# Patient Record
Sex: Female | Born: 1960 | Race: White | Hispanic: No | Marital: Married | Smoking: Never smoker
Health system: Southern US, Community
[De-identification: ages and names within clinical notes are randomized; demographics above are authoritative.]

## PROBLEM LIST (undated history)

## (undated) DIAGNOSIS — M545 Low back pain, unspecified: Secondary | ICD-10-CM

## (undated) DIAGNOSIS — M25562 Pain in left knee: Secondary | ICD-10-CM

## (undated) DIAGNOSIS — I8390 Asymptomatic varicose veins of unspecified lower extremity: Secondary | ICD-10-CM

## (undated) DIAGNOSIS — D649 Anemia, unspecified: Secondary | ICD-10-CM

## (undated) HISTORY — DX: Asymptomatic varicose veins of unspecified lower extremity: I83.90

## (undated) HISTORY — PX: ABDOMINAL HYSTERECTOMY: SHX81

## (undated) HISTORY — PX: CHOLECYSTECTOMY: SHX55

## (undated) HISTORY — DX: Pain in left knee: M25.562

## (undated) HISTORY — DX: Low back pain: M54.5

## (undated) HISTORY — DX: Low back pain, unspecified: M54.50

## (undated) HISTORY — DX: Anemia, unspecified: D64.9

---

## 1989-10-10 HISTORY — PX: OTHER SURGICAL HISTORY: SHX169

## 1995-10-11 HISTORY — PX: BACK SURGERY: SHX140

## 1999-10-11 HISTORY — PX: OTHER SURGICAL HISTORY: SHX169

## 2009-07-01 ENCOUNTER — Ambulatory Visit: Payer: Self-pay | Admitting: Vascular Surgery

## 2009-07-08 ENCOUNTER — Encounter: Admission: RE | Admit: 2009-07-08 | Discharge: 2009-07-08 | Payer: Self-pay | Admitting: Vascular Surgery

## 2009-07-08 ENCOUNTER — Ambulatory Visit: Payer: Self-pay | Admitting: Vascular Surgery

## 2009-07-13 ENCOUNTER — Ambulatory Visit (HOSPITAL_COMMUNITY): Admission: RE | Admit: 2009-07-13 | Discharge: 2009-07-13 | Payer: Self-pay | Admitting: Obstetrics and Gynecology

## 2009-07-28 ENCOUNTER — Ambulatory Visit (HOSPITAL_COMMUNITY): Admission: RE | Admit: 2009-07-28 | Discharge: 2009-07-28 | Payer: Self-pay | Admitting: Obstetrics and Gynecology

## 2009-08-10 ENCOUNTER — Encounter: Payer: Self-pay | Admitting: Obstetrics and Gynecology

## 2009-08-10 ENCOUNTER — Ambulatory Visit (HOSPITAL_COMMUNITY): Admission: RE | Admit: 2009-08-10 | Discharge: 2009-08-10 | Payer: Self-pay | Admitting: Obstetrics and Gynecology

## 2010-03-06 IMAGING — US US TRANSVAGINAL NON-OB
1 series · 14 of 25 positions shown · non-contrast
Comparison: CT 07/08/2009

CLINICAL DATA: Right ovarian cyst of 48-year-old female

TRANSABDOMINAL AND TRANSVAGINAL ULTRASOUND OF PELVIS
TECHNIQUE: Both transabdominal and transvaginal ultrasound
examinations of the pelvis were performed including evaluation of
the uterus, ovaries, adnexal regions, and pelvic cul-de-sac.

[Series 1: us transvaginal non-ob · 0.32mm/px · 14 of 45 slices shown]
[im 1/45]
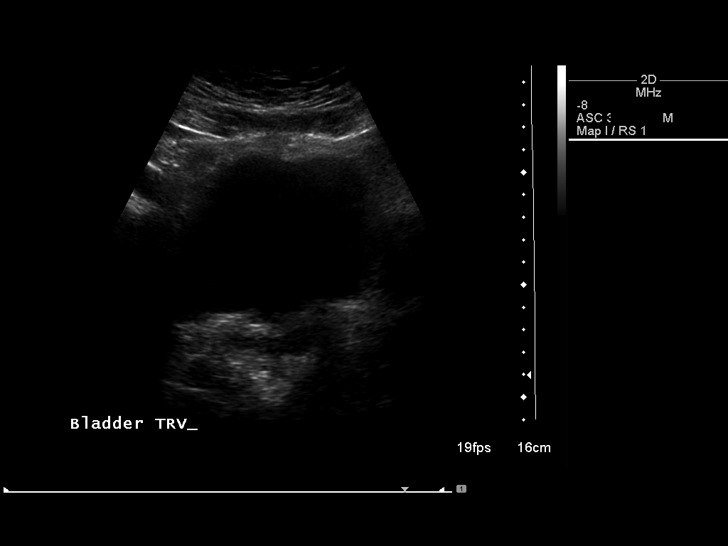
[im 4/45]
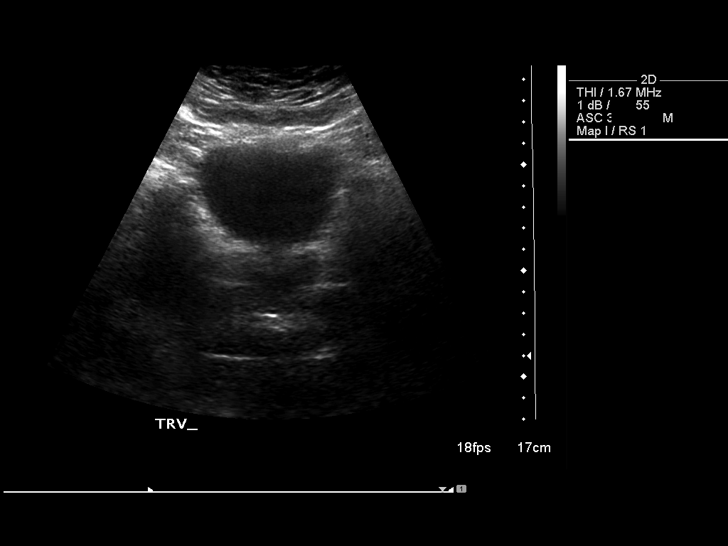
[im 8/45]
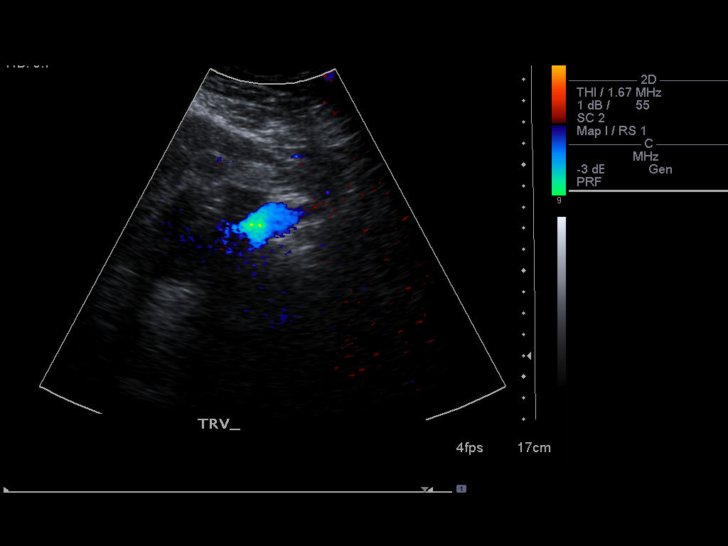
[im 12/45]
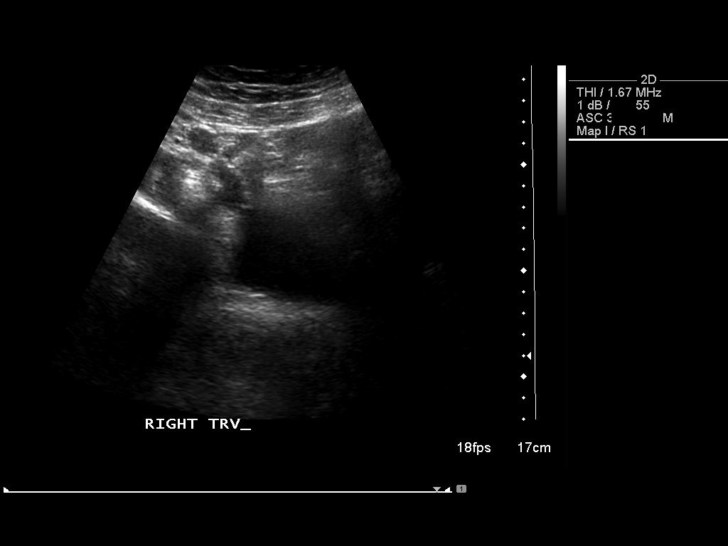
[im 15/45]
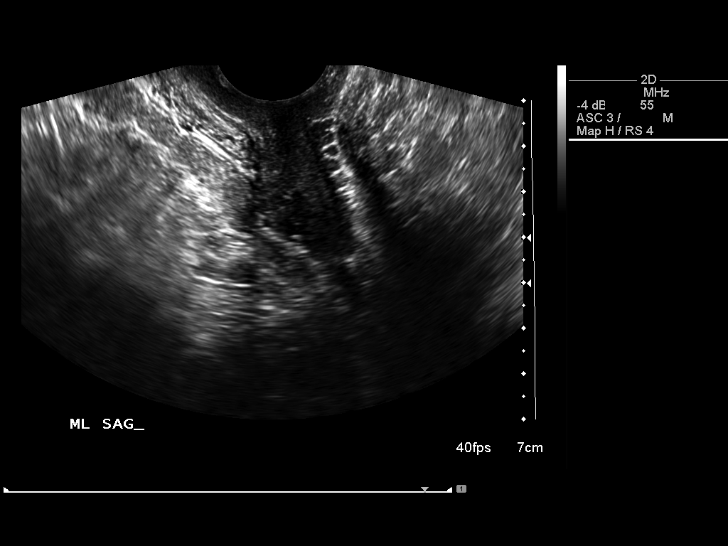
[im 17/45]
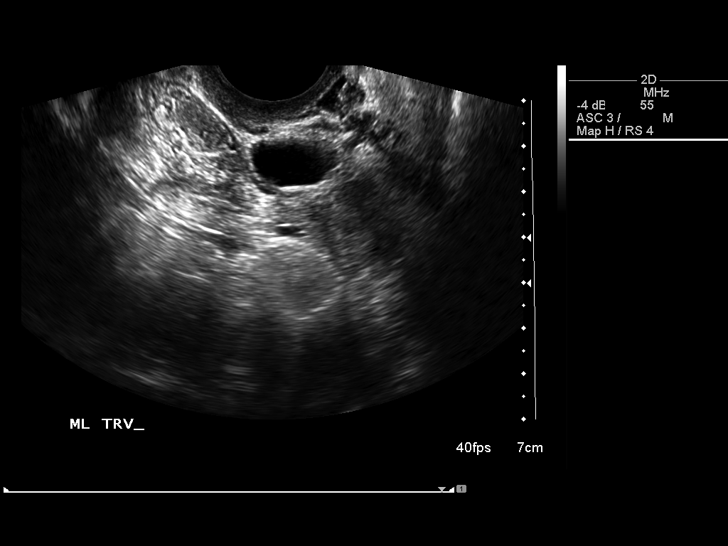
[im 21/45]
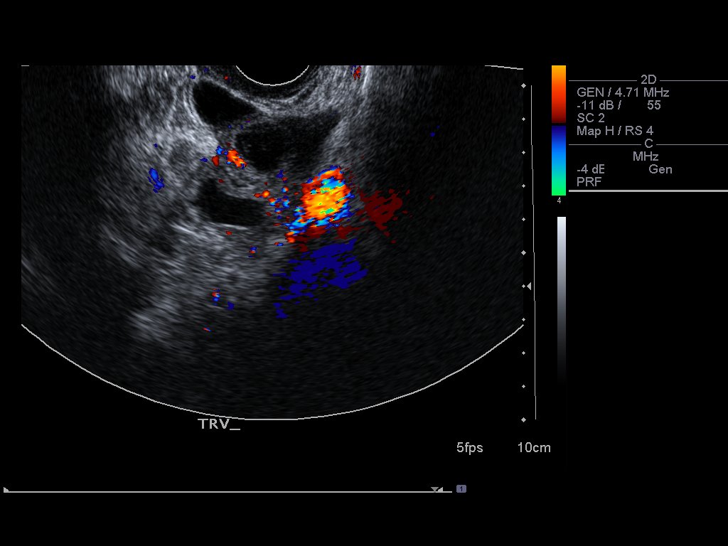
[im 24/45]
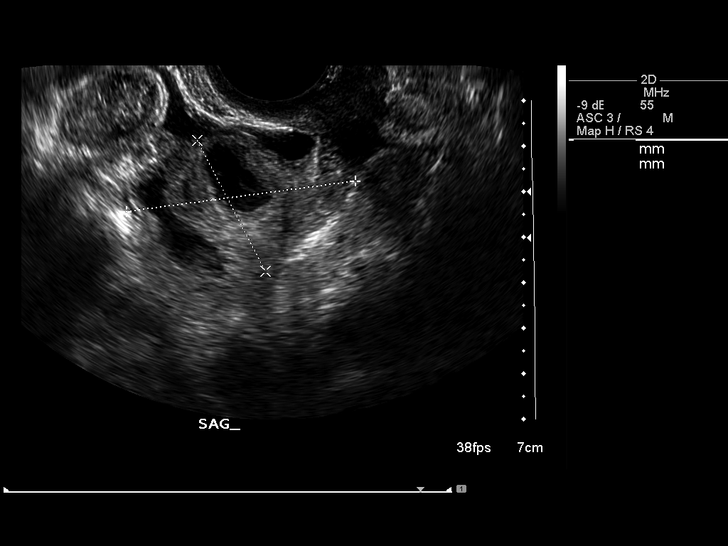
[im 28/45]
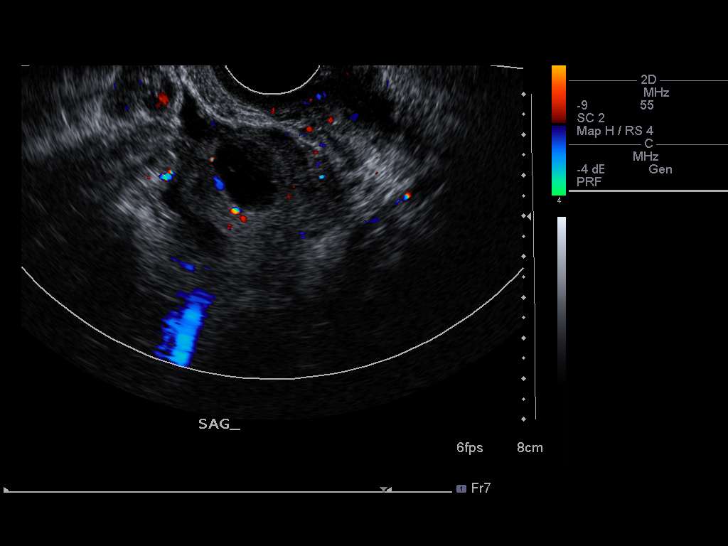
[im 30/45]
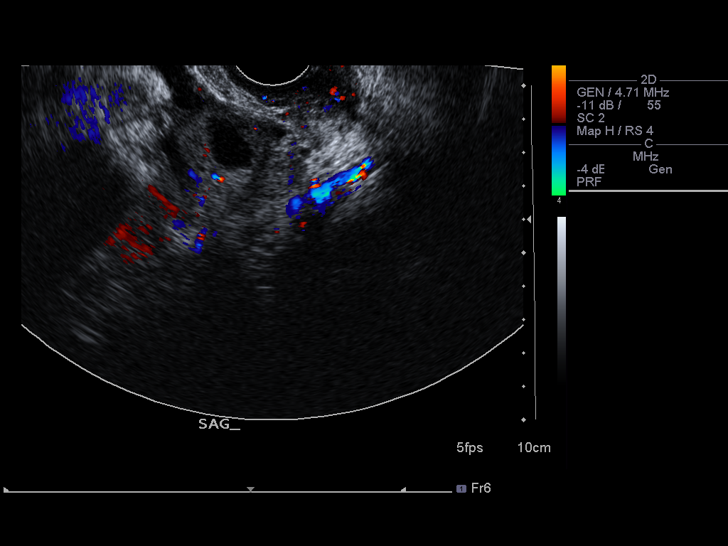
[im 34/45]
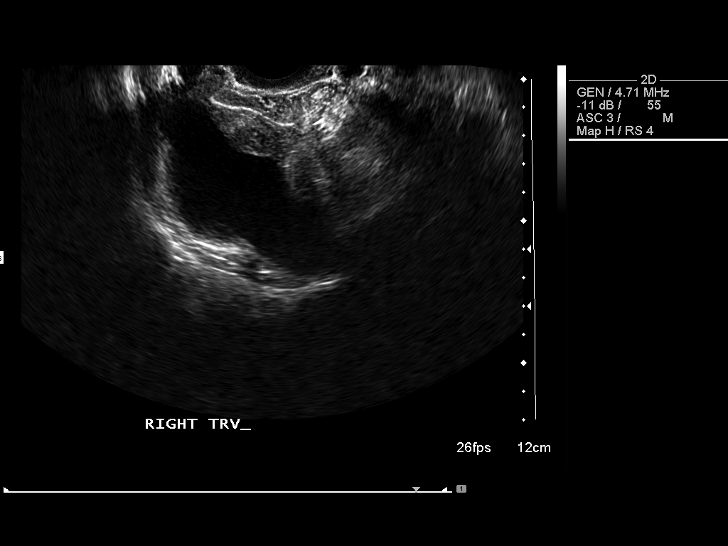
[im 37/45]
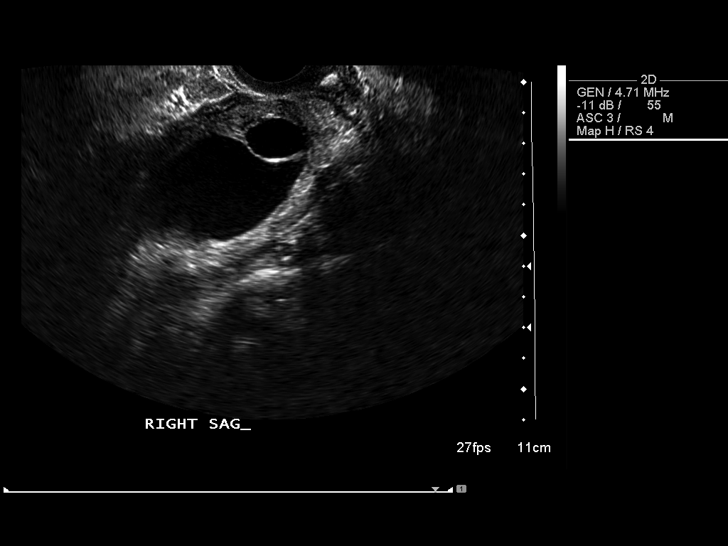
[im 41/45]
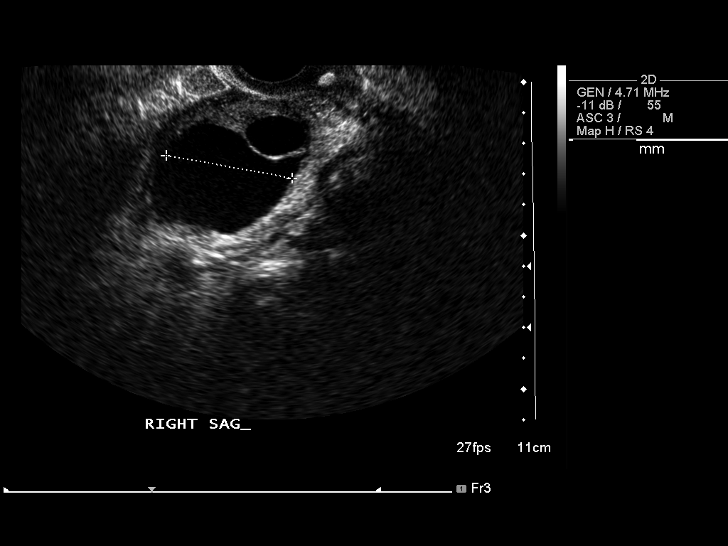
[im 45/45]
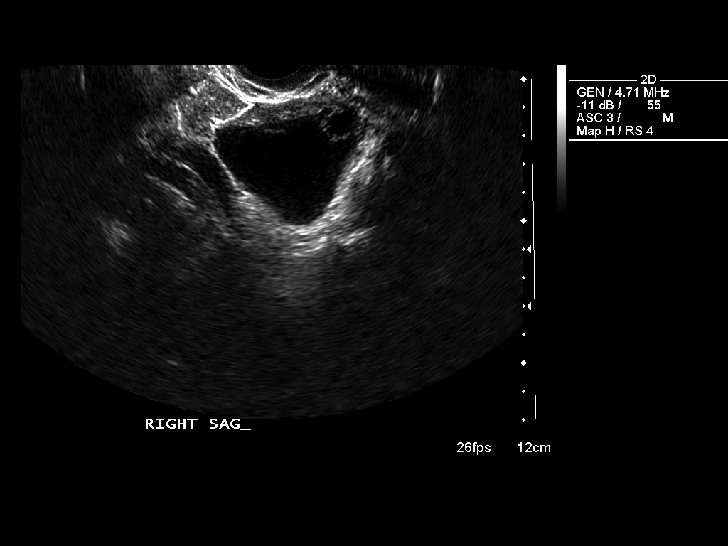

[14 of 25 positions shown; findings below may reference images not displayed]

FINDINGS: Uterus surgically absent

Endometrium there is an absent

Right Ovary There is a complex predominately cystic mass associate
right ovary measures 8.9 x 4.8 x 6.2 cm.  There is some wall
thickening associated this cystic lesion as well as flow within the
thickened wall.

Left Ovary Complex cystic lesion in the left ovary with wall
thickening and flow.  This mass measures 5.1 x 3.2 x 2.9 cm.

Other Findings:  None
IMPRESSION: Bilateral complex cystic ovarian lesions with wall thickening and
vascularity.  Differing size calculations may be secondary to
differing techniques however cannot exclude enlargement of these
lesions.  Findings are concerning for ovarian epithelial neoplasm.
An MRI without with contrast of the pelvis may be of value in
further characterizing these lesions or for surgical planning.

## 2011-01-12 LAB — URINALYSIS, ROUTINE W REFLEX MICROSCOPIC
Bilirubin Urine: NEGATIVE
Glucose, UA: NEGATIVE mg/dL
Hgb urine dipstick: NEGATIVE
Protein, ur: NEGATIVE mg/dL
Specific Gravity, Urine: 1.01 (ref 1.005–1.030)
Urobilinogen, UA: 0.2 mg/dL (ref 0.0–1.0)
pH: 7.5 (ref 5.0–8.0)

## 2011-01-13 LAB — TYPE AND SCREEN: ABO/RH(D): O POS

## 2011-01-13 LAB — COMPREHENSIVE METABOLIC PANEL
GFR calc non Af Amer: >60 mL/min (ref >60)
Potassium: 3.9 mEq/L (ref 3.5–5.1)
Sodium: 136 mEq/L (ref 135–145)
Total Bilirubin: 1 mg/dL (ref 0.3–1.2)
Total Protein: 7.3 g/dL (ref 6.0–8.3)

## 2011-01-13 LAB — CBC
HCT: 34 % — ABNORMAL LOW (ref 36.0–46.0)
MCV: 89 fL (ref 78.0–100.0)

## 2011-02-22 NOTE — Assessment & Plan Note (Signed)
OFFICE VISIT   Sandra Frank, Sandra Frank  DOB:  [birth year]                                       07/08/2009  CHART#:[id number]   Patient returns for follow-up today.  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  She returns  today for follow-up of her CT angiogram.  She denies any significant  pain in the back or flank or abdomen.  CT angiogram was evaluated and  examined today. This shows a renal cyst in the right kidney, which is  probably consistent with what was previously seen on renal duplex exam.  There is no renal artery aneurysm in the right or left kidney.  However,  she was noted to have a 5-cm right adnexal mass.  Evaluation with  ultrasound was suggested.  I discussed this with patient at length  today.  She does not need further follow-up with me, as there is no  aneurysm present in the right renal artery.  However, I did discuss with  her that she would need evaluation by her GYN doctor, Dr. Emelda Fear, in  [place].  He will make a decision regarding whether or not ultrasound  would be necessary to evaluate her adnexal mass.  She will obtain a copy  of her CT scan and CT scan report to take with her to her appointment  with Dr. Emelda Fear.   Janetta Hora. Fields, MD  Electronically Signed   CEF/MEDQ  D:  07/08/2009  T:  07/09/2009  Job:  [id number]   cc:   Cristela Blue, M.D.

## 2011-02-22 NOTE — Assessment & Plan Note (Signed)
OFFICE VISIT   ADAIJAH, ENDRES  DOB:  [birth year]                                       07/01/2009  CHART#:[id number]   The patient is a 50 year old female sent for evaluation for possible  renal artery aneurysm.  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  This was also seen  on a recent duplex ultrasound.  The aneurysm was found to be between  0.80-mm and 0.9-mm in diameter within the right renal parenchyma.  The  patient denies any flank or abdominal pain.  She has no history of  hypertension.  She has no history of renal dysfunction.  She has no  family history of renal aneurysms.   PAST MEDICAL HISTORY:  Is fairly unremarkable.   PAST SURGICAL HISTORY:  She has had lumbar spine surgery,  cholecystectomy and a hysterectomy.   MEDICATIONS:  Nabumetone 750 mg p.r.n., Skelaxin 800 mg p.r.n., aspirin  81 mg once a day.   She states a side effect to Darvocet of nausea.   FAMILY HISTORY:  Is unremarkable for any aneurysm disease.   SOCIAL HISTORY:  She is married.  She is a nonsmoker, non-consumer of  alcohol.   REVIEW OF SYSTEMS:  She is 5 feet 9 inches, 265 pounds.  RENAL:  She has some frequency.  ORTHOPEDIC:  She has multiple joint arthritis and muscle pain.  HEMATOLOGIC:  She has a history of chronic anemia.  CARDIAC, PULMONARY, GI, VASCULAR, NEUROLOGIC, PSYCHIATRIC, ENT AND  REVIEW OF SYSTEMS:  Are otherwise negative.   PHYSICAL EXAM:  Blood pressure is 134/85 in the left arm, pulse is 69  and regular.  HEENT:  Unremarkable.  Neck:  Has 2+ carotid pulses  without bruit.  Chest:  Clear to auscultation.  Cardiac Exam:  Regular  rate and rhythm without murmur.  Abdomen:  Is obese, soft, nontender,  nondistended.  No masses.  Extremities:  She has 2+ radial, femoral,  dorsalis pedis and posterior tibial pulses bilaterally.  She has no  audible bruit within the abdomen.   I reviewed her  renal ultrasound done at [facility] dated May 26, 2009.  This showed a possible 8-mm to 9-mm rounded structure in the  right kidney which did have Doppler flow in it and it was possibly an  aneurysm.   I believe the best option for the patient at this point would be a CT  angiogram to further define the structure in her right kidney.  If this  is indeed an aneurysm it is fairly small in character and would  certainly warrant observation over time and no intervention at this  time.  She will return for follow-up with me after a CT angiogram and we  will discuss the findings with her at that time.   Janetta Hora. Fields, MD  Electronically Signed   CEF/MEDQ  D:  07/01/2009  T:  07/02/2009  Job:  [id number]   cc:   Donzetta Sprung

## 2011-05-23 DIAGNOSIS — R079 Chest pain, unspecified: Secondary | ICD-10-CM

## 2011-06-07 ENCOUNTER — Other Ambulatory Visit: Payer: Self-pay | Admitting: Obstetrics and Gynecology

## 2011-06-07 DIAGNOSIS — Z139 Encounter for screening, unspecified: Secondary | ICD-10-CM

## 2011-06-16 ENCOUNTER — Ambulatory Visit (HOSPITAL_COMMUNITY)
Admission: RE | Admit: 2011-06-16 | Discharge: 2011-06-16 | Disposition: A | Discharge status: Home / Self Care | Payer: BC Managed Care – PPO | Source: Ambulatory Visit | Attending: Obstetrics and Gynecology | Admitting: Obstetrics and Gynecology

## 2011-06-16 ENCOUNTER — Ambulatory Visit (HOSPITAL_COMMUNITY): Payer: Managed Care, Other (non HMO)

## 2011-06-16 DIAGNOSIS — Z1231 Encounter for screening mammogram for malignant neoplasm of breast: Secondary | ICD-10-CM | POA: Insufficient documentation

## 2011-06-16 DIAGNOSIS — Z139 Encounter for screening, unspecified: Secondary | ICD-10-CM

## 2012-02-21 ENCOUNTER — Ambulatory Visit (INDEPENDENT_AMBULATORY_CARE_PROVIDER_SITE_OTHER): Payer: BC Managed Care – PPO | Admitting: Urology

## 2012-02-21 DIAGNOSIS — N949 Unspecified condition associated with female genital organs and menstrual cycle: Secondary | ICD-10-CM

## 2012-02-21 DIAGNOSIS — N8110 Cystocele, unspecified: Secondary | ICD-10-CM

## 2012-02-21 DIAGNOSIS — N3946 Mixed incontinence: Secondary | ICD-10-CM

## 2012-03-22 ENCOUNTER — Other Ambulatory Visit: Payer: Self-pay

## 2012-03-22 DIAGNOSIS — I83893 Varicose veins of bilateral lower extremities with other complications: Secondary | ICD-10-CM

## 2012-04-25 ENCOUNTER — Encounter: Payer: Self-pay | Admitting: Vascular Surgery

## 2012-05-08 ENCOUNTER — Ambulatory Visit (INDEPENDENT_AMBULATORY_CARE_PROVIDER_SITE_OTHER): Payer: BC Managed Care – PPO | Admitting: Urology

## 2012-05-08 DIAGNOSIS — N3946 Mixed incontinence: Secondary | ICD-10-CM

## 2012-05-08 DIAGNOSIS — N8110 Cystocele, unspecified: Secondary | ICD-10-CM

## 2012-05-09 ENCOUNTER — Encounter: Payer: Self-pay | Admitting: Vascular Surgery

## 2012-05-09 ENCOUNTER — Other Ambulatory Visit: Payer: Self-pay | Admitting: Obstetrics and Gynecology

## 2012-05-09 DIAGNOSIS — Z139 Encounter for screening, unspecified: Secondary | ICD-10-CM

## 2012-05-10 ENCOUNTER — Ambulatory Visit (INDEPENDENT_AMBULATORY_CARE_PROVIDER_SITE_OTHER): Payer: BC Managed Care – PPO | Admitting: Vascular Surgery

## 2012-05-10 ENCOUNTER — Encounter: Payer: Self-pay | Admitting: Vascular Surgery

## 2012-05-10 ENCOUNTER — Encounter (INDEPENDENT_AMBULATORY_CARE_PROVIDER_SITE_OTHER): Payer: BC Managed Care – PPO | Admitting: *Deleted

## 2012-05-10 VITALS — BP 161/94 | HR 67 | Resp 16 | Ht 68.0 in | Wt 288.0 lb

## 2012-05-10 DIAGNOSIS — I83893 Varicose veins of bilateral lower extremities with other complications: Secondary | ICD-10-CM

## 2012-05-10 DIAGNOSIS — R609 Edema, unspecified: Secondary | ICD-10-CM

## 2012-05-10 NOTE — Progress Notes (Signed)
[facility] [place] HISTORY AND PHYSICAL   History of Present Illness:  Patient is a 51 y.o. year old female who presents for evaluation of pain and swelling in her lower extremities. Her left leg is worse than her right. She develops swelling after being on legs all day. She has been and lower extremity compression stockings 20-30 mm for the last month prescribed Dr. Reuel Boom. She has had some relief with this. However, she is still fairly disabled by her symptoms. Her symptoms are relieved with leg elevation. She does not describe claudication..  Other medical problems include chronic back pain chronic knee pain all of which are currently stable. She denies prior history of DVT. She did injure her left leg several years ago had some swelling at that time but does not know she had a DVT.  Past Medical History  Diagnosis Date  . Varicose veins   . Low back pain     History of surgery and epidural injections  . Left knee pain   . Anemia     Past Surgical History  Procedure Date  . Abdominal hysterectomy     2010  . Tubaligation 1991  . Cholecystectomy   . Back surgery 1997  . Bladder  2001    tact     Social History History  Substance Use Topics  . Smoking status: Never Smoker   . Smokeless tobacco: Never Used  . Alcohol Use: No    Family History Family History  Problem Relation Age of Onset  . Heart disease Mother   . Hypertension Mother   . Other Mother     varicose veins  . Hyperlipidemia Daughter   . Hypertension Daughter   . Other Daughter     varicose veins    Allergies  No Known Allergies   Current Outpatient Prescriptions  Medication Sig Dispense Refill  . dicyclomine (BENTYL) 20 MG tablet Take 20 mg by mouth every 6 (six) hours.      . meloxicam (MOBIC) 15 MG tablet Take 15 mg by mouth daily.      . mirabegron ER (MYRBETRIQ) 25 MG TB24 Take 25 mg by mouth daily.      . Multiple Vitamins-Minerals (MULTIVITAMIN PO) Take 1 tablet by  mouth daily.      . Omega-3 Fatty Acids-Vitamin E (COROMEGA PO) Take by mouth daily.      Marland Kitchen omeprazole (PRILOSEC) 20 MG capsule Take 20 mg by mouth daily.      Marland Kitchen tiZANidine (ZANAFLEX) 4 MG tablet Take 4 mg by mouth every 8 (eight) hours as needed.       Marland Kitchen estrogens, conjugated, (PREMARIN) 0.625 MG tablet Take 0.625 mg by mouth daily. Take daily for 21 days then do not take for 7 days.      . fesoterodine (TOVIAZ) 4 MG TB24 Take 4 mg by mouth daily.      . naproxen (NAPROSYN) 500 MG tablet Take 500 mg by mouth 2 (two) times daily with a meal.        ROS:   General:  No weight loss, Fever, chills  HEENT: No recent headaches, no nasal bleeding, no visual changes, no sore throat  Neurologic: No dizziness, blackouts, seizures. No recent symptoms of stroke or mini- stroke. No recent episodes of slurred speech, or temporary blindness.  Cardiac: No recent episodes of chest pain/pressure, no shortness of breath at rest.  No shortness of breath with exertion.  Denies history of atrial fibrillation or irregular heartbeat  Vascular: No history of rest pain in feet.  No history of claudication.  No history of non-healing ulcer, No history of DVT   Pulmonary: No home oxygen, no productive cough, no hemoptysis,  No asthma or wheezing  Musculoskeletal:  [x ] Arthritis, [x ] Low back pain,  [x ] Joint pain  Hematologic:No history of hypercoagulable state.  No history of easy bleeding.  No history of anemia  Gastrointestinal: No hematochezia or melena,  No gastroesophageal reflux, no trouble swallowing  Urinary: [ ]  chronic Kidney disease, [ ]  on HD - [ ]  MWF or [ ]  TTHS, [ ]  Burning with urination, [ ]  Frequent urination, [ ]  Difficulty urinating;   Skin: No rashes  Psychological: No history of anxiety,  No history of depression   Physical Examination  Filed Vitals:   05/10/12 1210  BP: 161/94  Pulse: 67  Resp: 16  Height: 5\' 8"  (1.727 m)  Weight: 288 lb (130.636 kg)  SpO2: 100%     Body mass index is 43.79 kg/(m^2).  General:  Alert and oriented, no acute distress HEENT: Normal Neck: No bruit or JVD Pulmonary: Clear to auscultation bilaterally Cardiac: Regular Rate and Rhythm without murmur Abdomen: Soft, non-tender, non-distended, no mass, obese Skin: No rash, several clusters of spider-type varicosities over the right posterior hip right lateral leg as well as the medial aspect of the left leg, there is a large cluster of easily palpable varicosities in the medial aspect of the left leg which are 6-7 mm in diameter there is a similar cluster on the right leg although less prominent Extremity Pulses:  2+ radial, brachial, femoral, dorsalis pedis pulses bilaterally Musculoskeletal: No deformity or edema  Neurologic: Upper and lower extremity motor 5/5 and symmetric  DATA: The patient had a venous duplex exam today which I reviewed and interpreted. 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 She also did have mild deep venous reflux. She had no evidence of DVT.   ASSESSMENT: Bilateral symptomatic varicose veins   PLAN:  The patient was given a prescription today for the pantyhose length compression stockings (20-30 mm Hg) that she thinks she will have much better compliance with these. She'll return in 3 months time to see my partner Dr. early to consider laser ablation.  Fabienne Bruns, MD [facility] [place] Office: [phone] Pager: [phone]

## 2012-05-22 ENCOUNTER — Telehealth: Payer: Self-pay | Admitting: *Deleted

## 2012-05-22 NOTE — Telephone Encounter (Signed)
In response to Long Term Disability claim and paperwork received from [employer]. Ladona Ridgel from the [employer] in [place], [place], I notified Mrs. Nasby that [facility] did not handle long-term disability claims and paperwork.  I suggested that she contact her Primary Care Physician to assist her with the long term disability process.  Rankin, Neena Rhymes

## 2012-06-19 ENCOUNTER — Ambulatory Visit (HOSPITAL_COMMUNITY)
Admission: RE | Admit: 2012-06-19 | Discharge: 2012-06-19 | Disposition: A | Discharge status: Home / Self Care | Payer: BC Managed Care – PPO | Source: Ambulatory Visit | Attending: Obstetrics and Gynecology | Admitting: Obstetrics and Gynecology

## 2012-06-19 DIAGNOSIS — Z139 Encounter for screening, unspecified: Secondary | ICD-10-CM

## 2012-06-19 DIAGNOSIS — Z1231 Encounter for screening mammogram for malignant neoplasm of breast: Secondary | ICD-10-CM | POA: Insufficient documentation

## 2012-08-13 ENCOUNTER — Encounter: Payer: Self-pay | Admitting: Vascular Surgery

## 2012-08-14 ENCOUNTER — Ambulatory Visit: Payer: BC Managed Care – PPO | Admitting: Vascular Surgery

## 2013-07-18 DIAGNOSIS — Z0279 Encounter for issue of other medical certificate: Secondary | ICD-10-CM

## 2015-05-27 DIAGNOSIS — I872 Venous insufficiency (chronic) (peripheral): Secondary | ICD-10-CM | POA: Insufficient documentation

## 2016-02-15 DIAGNOSIS — I83892 Varicose veins of left lower extremities with other complications: Secondary | ICD-10-CM | POA: Diagnosis not present

## 2016-02-22 DIAGNOSIS — I872 Venous insufficiency (chronic) (peripheral): Secondary | ICD-10-CM | POA: Diagnosis not present

## 2016-03-25 DIAGNOSIS — J301 Allergic rhinitis due to pollen: Secondary | ICD-10-CM | POA: Diagnosis not present

## 2016-03-25 DIAGNOSIS — F331 Major depressive disorder, recurrent, moderate: Secondary | ICD-10-CM | POA: Diagnosis not present

## 2016-03-25 DIAGNOSIS — E6609 Other obesity due to excess calories: Secondary | ICD-10-CM | POA: Diagnosis not present

## 2016-03-25 DIAGNOSIS — M170 Bilateral primary osteoarthritis of knee: Secondary | ICD-10-CM | POA: Diagnosis not present

## 2016-03-25 DIAGNOSIS — Z6841 Body Mass Index (BMI) 40.0 and over, adult: Secondary | ICD-10-CM | POA: Diagnosis not present

## 2016-03-25 DIAGNOSIS — Z1389 Encounter for screening for other disorder: Secondary | ICD-10-CM | POA: Diagnosis not present

## 2016-03-25 DIAGNOSIS — I10 Essential (primary) hypertension: Secondary | ICD-10-CM | POA: Diagnosis not present

## 2016-03-25 DIAGNOSIS — E782 Mixed hyperlipidemia: Secondary | ICD-10-CM | POA: Diagnosis not present

## 2016-04-13 DIAGNOSIS — I872 Venous insufficiency (chronic) (peripheral): Secondary | ICD-10-CM | POA: Diagnosis not present

## 2016-04-27 DIAGNOSIS — S90112A Contusion of left great toe without damage to nail, initial encounter: Secondary | ICD-10-CM | POA: Diagnosis not present

## 2016-04-27 DIAGNOSIS — M79672 Pain in left foot: Secondary | ICD-10-CM | POA: Diagnosis not present

## 2016-04-27 DIAGNOSIS — M1990 Unspecified osteoarthritis, unspecified site: Secondary | ICD-10-CM | POA: Diagnosis not present

## 2016-05-19 DIAGNOSIS — S90112A Contusion of left great toe without damage to nail, initial encounter: Secondary | ICD-10-CM | POA: Diagnosis not present

## 2016-05-19 DIAGNOSIS — M1990 Unspecified osteoarthritis, unspecified site: Secondary | ICD-10-CM | POA: Diagnosis not present

## 2016-05-19 DIAGNOSIS — M79672 Pain in left foot: Secondary | ICD-10-CM | POA: Diagnosis not present

## 2016-06-15 DIAGNOSIS — Z6841 Body Mass Index (BMI) 40.0 and over, adult: Secondary | ICD-10-CM | POA: Diagnosis not present

## 2016-06-15 DIAGNOSIS — M545 Low back pain: Secondary | ICD-10-CM | POA: Diagnosis not present

## 2016-08-03 DIAGNOSIS — S90112A Contusion of left great toe without damage to nail, initial encounter: Secondary | ICD-10-CM | POA: Diagnosis not present

## 2016-08-03 DIAGNOSIS — M1990 Unspecified osteoarthritis, unspecified site: Secondary | ICD-10-CM | POA: Diagnosis not present

## 2016-08-03 DIAGNOSIS — M79672 Pain in left foot: Secondary | ICD-10-CM | POA: Diagnosis not present

## 2016-08-10 DIAGNOSIS — M170 Bilateral primary osteoarthritis of knee: Secondary | ICD-10-CM | POA: Diagnosis not present

## 2016-08-10 DIAGNOSIS — E6609 Other obesity due to excess calories: Secondary | ICD-10-CM | POA: Diagnosis not present

## 2016-08-10 DIAGNOSIS — Z6841 Body Mass Index (BMI) 40.0 and over, adult: Secondary | ICD-10-CM | POA: Diagnosis not present

## 2016-08-10 DIAGNOSIS — I10 Essential (primary) hypertension: Secondary | ICD-10-CM | POA: Diagnosis not present

## 2016-08-10 DIAGNOSIS — M545 Low back pain: Secondary | ICD-10-CM | POA: Diagnosis not present

## 2016-08-10 DIAGNOSIS — F331 Major depressive disorder, recurrent, moderate: Secondary | ICD-10-CM | POA: Diagnosis not present

## 2016-08-10 DIAGNOSIS — K580 Irritable bowel syndrome with diarrhea: Secondary | ICD-10-CM | POA: Diagnosis not present

## 2016-08-10 DIAGNOSIS — E782 Mixed hyperlipidemia: Secondary | ICD-10-CM | POA: Diagnosis not present

## 2016-09-30 DIAGNOSIS — M47816 Spondylosis without myelopathy or radiculopathy, lumbar region: Secondary | ICD-10-CM | POA: Diagnosis not present

## 2016-09-30 DIAGNOSIS — M9903 Segmental and somatic dysfunction of lumbar region: Secondary | ICD-10-CM | POA: Diagnosis not present

## 2016-09-30 DIAGNOSIS — M5442 Lumbago with sciatica, left side: Secondary | ICD-10-CM | POA: Diagnosis not present

## 2016-09-30 DIAGNOSIS — M19072 Primary osteoarthritis, left ankle and foot: Secondary | ICD-10-CM | POA: Diagnosis not present

## 2016-10-05 DIAGNOSIS — M9903 Segmental and somatic dysfunction of lumbar region: Secondary | ICD-10-CM | POA: Diagnosis not present

## 2016-10-05 DIAGNOSIS — M47816 Spondylosis without myelopathy or radiculopathy, lumbar region: Secondary | ICD-10-CM | POA: Diagnosis not present

## 2016-10-05 DIAGNOSIS — M19072 Primary osteoarthritis, left ankle and foot: Secondary | ICD-10-CM | POA: Diagnosis not present

## 2016-10-05 DIAGNOSIS — M5442 Lumbago with sciatica, left side: Secondary | ICD-10-CM | POA: Diagnosis not present

## 2016-10-13 DIAGNOSIS — M5442 Lumbago with sciatica, left side: Secondary | ICD-10-CM | POA: Diagnosis not present

## 2016-10-13 DIAGNOSIS — M47816 Spondylosis without myelopathy or radiculopathy, lumbar region: Secondary | ICD-10-CM | POA: Diagnosis not present

## 2016-10-13 DIAGNOSIS — M19072 Primary osteoarthritis, left ankle and foot: Secondary | ICD-10-CM | POA: Diagnosis not present

## 2016-10-13 DIAGNOSIS — M9903 Segmental and somatic dysfunction of lumbar region: Secondary | ICD-10-CM | POA: Diagnosis not present

## 2016-11-29 DIAGNOSIS — J301 Allergic rhinitis due to pollen: Secondary | ICD-10-CM | POA: Diagnosis not present

## 2016-11-29 DIAGNOSIS — I10 Essential (primary) hypertension: Secondary | ICD-10-CM | POA: Diagnosis not present

## 2016-11-29 DIAGNOSIS — M545 Low back pain: Secondary | ICD-10-CM | POA: Diagnosis not present

## 2016-11-29 DIAGNOSIS — F331 Major depressive disorder, recurrent, moderate: Secondary | ICD-10-CM | POA: Diagnosis not present

## 2016-11-29 DIAGNOSIS — M170 Bilateral primary osteoarthritis of knee: Secondary | ICD-10-CM | POA: Diagnosis not present

## 2016-11-29 DIAGNOSIS — E782 Mixed hyperlipidemia: Secondary | ICD-10-CM | POA: Diagnosis not present

## 2016-11-29 DIAGNOSIS — E6609 Other obesity due to excess calories: Secondary | ICD-10-CM | POA: Diagnosis not present

## 2016-11-29 DIAGNOSIS — K580 Irritable bowel syndrome with diarrhea: Secondary | ICD-10-CM | POA: Diagnosis not present

## 2016-12-07 DIAGNOSIS — M1711 Unilateral primary osteoarthritis, right knee: Secondary | ICD-10-CM | POA: Diagnosis not present

## 2017-04-04 DIAGNOSIS — R739 Hyperglycemia, unspecified: Secondary | ICD-10-CM | POA: Diagnosis not present

## 2017-04-04 DIAGNOSIS — K580 Irritable bowel syndrome with diarrhea: Secondary | ICD-10-CM | POA: Diagnosis not present

## 2017-04-04 DIAGNOSIS — I10 Essential (primary) hypertension: Secondary | ICD-10-CM | POA: Diagnosis not present

## 2017-04-04 DIAGNOSIS — Z9189 Other specified personal risk factors, not elsewhere classified: Secondary | ICD-10-CM | POA: Diagnosis not present

## 2017-04-04 DIAGNOSIS — M1711 Unilateral primary osteoarthritis, right knee: Secondary | ICD-10-CM | POA: Diagnosis not present

## 2017-04-04 DIAGNOSIS — E782 Mixed hyperlipidemia: Secondary | ICD-10-CM | POA: Diagnosis not present

## 2017-04-04 DIAGNOSIS — F331 Major depressive disorder, recurrent, moderate: Secondary | ICD-10-CM | POA: Diagnosis not present

## 2017-04-06 DIAGNOSIS — E6609 Other obesity due to excess calories: Secondary | ICD-10-CM | POA: Diagnosis not present

## 2017-04-06 DIAGNOSIS — I10 Essential (primary) hypertension: Secondary | ICD-10-CM | POA: Diagnosis not present

## 2017-04-06 DIAGNOSIS — Z6839 Body mass index (BMI) 39.0-39.9, adult: Secondary | ICD-10-CM | POA: Diagnosis not present

## 2017-04-06 DIAGNOSIS — J301 Allergic rhinitis due to pollen: Secondary | ICD-10-CM | POA: Diagnosis not present

## 2017-04-06 DIAGNOSIS — E782 Mixed hyperlipidemia: Secondary | ICD-10-CM | POA: Diagnosis not present

## 2017-04-06 DIAGNOSIS — F331 Major depressive disorder, recurrent, moderate: Secondary | ICD-10-CM | POA: Diagnosis not present

## 2017-04-06 DIAGNOSIS — K580 Irritable bowel syndrome with diarrhea: Secondary | ICD-10-CM | POA: Diagnosis not present

## 2017-04-06 DIAGNOSIS — Z0001 Encounter for general adult medical examination with abnormal findings: Secondary | ICD-10-CM | POA: Diagnosis not present

## 2017-04-20 DIAGNOSIS — Z6839 Body mass index (BMI) 39.0-39.9, adult: Secondary | ICD-10-CM | POA: Diagnosis not present

## 2017-04-20 DIAGNOSIS — I10 Essential (primary) hypertension: Secondary | ICD-10-CM | POA: Diagnosis not present

## 2017-04-20 DIAGNOSIS — M1712 Unilateral primary osteoarthritis, left knee: Secondary | ICD-10-CM | POA: Diagnosis not present

## 2017-08-01 DIAGNOSIS — Z9189 Other specified personal risk factors, not elsewhere classified: Secondary | ICD-10-CM | POA: Diagnosis not present

## 2017-08-01 DIAGNOSIS — K580 Irritable bowel syndrome with diarrhea: Secondary | ICD-10-CM | POA: Diagnosis not present

## 2017-08-01 DIAGNOSIS — M170 Bilateral primary osteoarthritis of knee: Secondary | ICD-10-CM | POA: Diagnosis not present

## 2017-08-01 DIAGNOSIS — F331 Major depressive disorder, recurrent, moderate: Secondary | ICD-10-CM | POA: Diagnosis not present

## 2017-08-01 DIAGNOSIS — M1712 Unilateral primary osteoarthritis, left knee: Secondary | ICD-10-CM | POA: Diagnosis not present

## 2017-08-01 DIAGNOSIS — I10 Essential (primary) hypertension: Secondary | ICD-10-CM | POA: Diagnosis not present

## 2017-08-01 DIAGNOSIS — M545 Low back pain: Secondary | ICD-10-CM | POA: Diagnosis not present

## 2017-08-01 DIAGNOSIS — E782 Mixed hyperlipidemia: Secondary | ICD-10-CM | POA: Diagnosis not present

## 2017-08-01 DIAGNOSIS — Z6839 Body mass index (BMI) 39.0-39.9, adult: Secondary | ICD-10-CM | POA: Diagnosis not present

## 2017-08-01 DIAGNOSIS — E6609 Other obesity due to excess calories: Secondary | ICD-10-CM | POA: Diagnosis not present

## 2017-08-01 DIAGNOSIS — J301 Allergic rhinitis due to pollen: Secondary | ICD-10-CM | POA: Diagnosis not present

## 2017-08-02 DIAGNOSIS — M1712 Unilateral primary osteoarthritis, left knee: Secondary | ICD-10-CM | POA: Diagnosis not present

## 2017-08-02 DIAGNOSIS — M1711 Unilateral primary osteoarthritis, right knee: Secondary | ICD-10-CM | POA: Diagnosis not present

## 2017-08-02 DIAGNOSIS — Z6839 Body mass index (BMI) 39.0-39.9, adult: Secondary | ICD-10-CM | POA: Diagnosis not present

## 2017-08-09 DIAGNOSIS — Z6841 Body Mass Index (BMI) 40.0 and over, adult: Secondary | ICD-10-CM | POA: Diagnosis not present

## 2017-08-09 DIAGNOSIS — M1711 Unilateral primary osteoarthritis, right knee: Secondary | ICD-10-CM | POA: Diagnosis not present

## 2017-08-09 DIAGNOSIS — M1712 Unilateral primary osteoarthritis, left knee: Secondary | ICD-10-CM | POA: Diagnosis not present

## 2017-08-17 DIAGNOSIS — M545 Low back pain: Secondary | ICD-10-CM | POA: Diagnosis not present

## 2017-08-17 DIAGNOSIS — I10 Essential (primary) hypertension: Secondary | ICD-10-CM | POA: Diagnosis not present

## 2017-08-17 DIAGNOSIS — M1712 Unilateral primary osteoarthritis, left knee: Secondary | ICD-10-CM | POA: Diagnosis not present

## 2017-08-17 DIAGNOSIS — M1711 Unilateral primary osteoarthritis, right knee: Secondary | ICD-10-CM | POA: Diagnosis not present

## 2017-08-17 DIAGNOSIS — Z6841 Body Mass Index (BMI) 40.0 and over, adult: Secondary | ICD-10-CM | POA: Diagnosis not present

## 2017-12-22 DIAGNOSIS — M65312 Trigger thumb, left thumb: Secondary | ICD-10-CM | POA: Diagnosis not present

## 2018-01-03 DIAGNOSIS — M1712 Unilateral primary osteoarthritis, left knee: Secondary | ICD-10-CM | POA: Diagnosis not present

## 2018-01-18 DIAGNOSIS — M1712 Unilateral primary osteoarthritis, left knee: Secondary | ICD-10-CM | POA: Diagnosis not present

## 2018-01-18 DIAGNOSIS — M958 Other specified acquired deformities of musculoskeletal system: Secondary | ICD-10-CM | POA: Diagnosis not present

## 2018-01-18 DIAGNOSIS — G8918 Other acute postprocedural pain: Secondary | ICD-10-CM | POA: Diagnosis not present

## 2018-01-18 DIAGNOSIS — M94262 Chondromalacia, left knee: Secondary | ICD-10-CM | POA: Diagnosis not present

## 2018-01-31 DIAGNOSIS — Z471 Aftercare following joint replacement surgery: Secondary | ICD-10-CM | POA: Diagnosis not present

## 2018-01-31 DIAGNOSIS — R269 Unspecified abnormalities of gait and mobility: Secondary | ICD-10-CM | POA: Diagnosis not present

## 2018-01-31 DIAGNOSIS — M25562 Pain in left knee: Secondary | ICD-10-CM | POA: Diagnosis not present

## 2018-01-31 DIAGNOSIS — R531 Weakness: Secondary | ICD-10-CM | POA: Diagnosis not present

## 2018-02-01 DIAGNOSIS — R531 Weakness: Secondary | ICD-10-CM | POA: Diagnosis not present

## 2018-02-01 DIAGNOSIS — M25562 Pain in left knee: Secondary | ICD-10-CM | POA: Diagnosis not present

## 2018-02-01 DIAGNOSIS — R269 Unspecified abnormalities of gait and mobility: Secondary | ICD-10-CM | POA: Diagnosis not present

## 2018-02-01 DIAGNOSIS — Z471 Aftercare following joint replacement surgery: Secondary | ICD-10-CM | POA: Diagnosis not present

## 2018-02-06 DIAGNOSIS — Z471 Aftercare following joint replacement surgery: Secondary | ICD-10-CM | POA: Diagnosis not present

## 2018-02-06 DIAGNOSIS — R269 Unspecified abnormalities of gait and mobility: Secondary | ICD-10-CM | POA: Diagnosis not present

## 2018-02-06 DIAGNOSIS — M25562 Pain in left knee: Secondary | ICD-10-CM | POA: Diagnosis not present

## 2018-02-06 DIAGNOSIS — R531 Weakness: Secondary | ICD-10-CM | POA: Diagnosis not present

## 2018-02-07 DIAGNOSIS — M1712 Unilateral primary osteoarthritis, left knee: Secondary | ICD-10-CM | POA: Diagnosis not present

## 2018-02-09 DIAGNOSIS — Z471 Aftercare following joint replacement surgery: Secondary | ICD-10-CM | POA: Diagnosis not present

## 2018-02-09 DIAGNOSIS — R531 Weakness: Secondary | ICD-10-CM | POA: Diagnosis not present

## 2018-02-09 DIAGNOSIS — M25562 Pain in left knee: Secondary | ICD-10-CM | POA: Diagnosis not present

## 2018-02-09 DIAGNOSIS — R269 Unspecified abnormalities of gait and mobility: Secondary | ICD-10-CM | POA: Diagnosis not present

## 2018-02-13 DIAGNOSIS — R531 Weakness: Secondary | ICD-10-CM | POA: Diagnosis not present

## 2018-02-13 DIAGNOSIS — Z471 Aftercare following joint replacement surgery: Secondary | ICD-10-CM | POA: Diagnosis not present

## 2018-02-13 DIAGNOSIS — R269 Unspecified abnormalities of gait and mobility: Secondary | ICD-10-CM | POA: Diagnosis not present

## 2018-02-13 DIAGNOSIS — M25562 Pain in left knee: Secondary | ICD-10-CM | POA: Diagnosis not present

## 2018-02-15 DIAGNOSIS — Z471 Aftercare following joint replacement surgery: Secondary | ICD-10-CM | POA: Diagnosis not present

## 2018-02-15 DIAGNOSIS — R531 Weakness: Secondary | ICD-10-CM | POA: Diagnosis not present

## 2018-02-15 DIAGNOSIS — M25562 Pain in left knee: Secondary | ICD-10-CM | POA: Diagnosis not present

## 2018-02-15 DIAGNOSIS — R269 Unspecified abnormalities of gait and mobility: Secondary | ICD-10-CM | POA: Diagnosis not present

## 2018-02-20 DIAGNOSIS — M25562 Pain in left knee: Secondary | ICD-10-CM | POA: Diagnosis not present

## 2018-02-20 DIAGNOSIS — Z471 Aftercare following joint replacement surgery: Secondary | ICD-10-CM | POA: Diagnosis not present

## 2018-02-20 DIAGNOSIS — R269 Unspecified abnormalities of gait and mobility: Secondary | ICD-10-CM | POA: Diagnosis not present

## 2018-02-20 DIAGNOSIS — R531 Weakness: Secondary | ICD-10-CM | POA: Diagnosis not present

## 2018-02-22 DIAGNOSIS — R269 Unspecified abnormalities of gait and mobility: Secondary | ICD-10-CM | POA: Diagnosis not present

## 2018-02-22 DIAGNOSIS — R531 Weakness: Secondary | ICD-10-CM | POA: Diagnosis not present

## 2018-02-22 DIAGNOSIS — Z471 Aftercare following joint replacement surgery: Secondary | ICD-10-CM | POA: Diagnosis not present

## 2018-02-22 DIAGNOSIS — M25562 Pain in left knee: Secondary | ICD-10-CM | POA: Diagnosis not present

## 2018-02-27 DIAGNOSIS — R269 Unspecified abnormalities of gait and mobility: Secondary | ICD-10-CM | POA: Diagnosis not present

## 2018-02-27 DIAGNOSIS — Z471 Aftercare following joint replacement surgery: Secondary | ICD-10-CM | POA: Diagnosis not present

## 2018-02-27 DIAGNOSIS — M25562 Pain in left knee: Secondary | ICD-10-CM | POA: Diagnosis not present

## 2018-02-27 DIAGNOSIS — R531 Weakness: Secondary | ICD-10-CM | POA: Diagnosis not present

## 2018-03-01 DIAGNOSIS — R269 Unspecified abnormalities of gait and mobility: Secondary | ICD-10-CM | POA: Diagnosis not present

## 2018-03-01 DIAGNOSIS — Z471 Aftercare following joint replacement surgery: Secondary | ICD-10-CM | POA: Diagnosis not present

## 2018-03-01 DIAGNOSIS — M25562 Pain in left knee: Secondary | ICD-10-CM | POA: Diagnosis not present

## 2018-03-01 DIAGNOSIS — R531 Weakness: Secondary | ICD-10-CM | POA: Diagnosis not present

## 2018-03-06 DIAGNOSIS — M25562 Pain in left knee: Secondary | ICD-10-CM | POA: Diagnosis not present

## 2018-03-06 DIAGNOSIS — R531 Weakness: Secondary | ICD-10-CM | POA: Diagnosis not present

## 2018-03-06 DIAGNOSIS — Z471 Aftercare following joint replacement surgery: Secondary | ICD-10-CM | POA: Diagnosis not present

## 2018-03-06 DIAGNOSIS — R269 Unspecified abnormalities of gait and mobility: Secondary | ICD-10-CM | POA: Diagnosis not present

## 2018-03-07 DIAGNOSIS — M1712 Unilateral primary osteoarthritis, left knee: Secondary | ICD-10-CM | POA: Diagnosis not present

## 2018-03-09 DIAGNOSIS — F331 Major depressive disorder, recurrent, moderate: Secondary | ICD-10-CM | POA: Diagnosis not present

## 2018-03-09 DIAGNOSIS — Z6841 Body Mass Index (BMI) 40.0 and over, adult: Secondary | ICD-10-CM | POA: Diagnosis not present

## 2018-03-09 DIAGNOSIS — M545 Low back pain: Secondary | ICD-10-CM | POA: Diagnosis not present

## 2018-03-09 DIAGNOSIS — Z1331 Encounter for screening for depression: Secondary | ICD-10-CM | POA: Diagnosis not present

## 2018-03-09 DIAGNOSIS — E782 Mixed hyperlipidemia: Secondary | ICD-10-CM | POA: Diagnosis not present

## 2018-03-09 DIAGNOSIS — I10 Essential (primary) hypertension: Secondary | ICD-10-CM | POA: Diagnosis not present

## 2018-03-09 DIAGNOSIS — Z1389 Encounter for screening for other disorder: Secondary | ICD-10-CM | POA: Diagnosis not present

## 2018-03-09 DIAGNOSIS — M170 Bilateral primary osteoarthritis of knee: Secondary | ICD-10-CM | POA: Diagnosis not present

## 2018-03-13 DIAGNOSIS — M25562 Pain in left knee: Secondary | ICD-10-CM | POA: Diagnosis not present

## 2018-03-13 DIAGNOSIS — R531 Weakness: Secondary | ICD-10-CM | POA: Diagnosis not present

## 2018-03-13 DIAGNOSIS — R269 Unspecified abnormalities of gait and mobility: Secondary | ICD-10-CM | POA: Diagnosis not present

## 2018-03-13 DIAGNOSIS — Z471 Aftercare following joint replacement surgery: Secondary | ICD-10-CM | POA: Diagnosis not present

## 2018-03-15 DIAGNOSIS — R531 Weakness: Secondary | ICD-10-CM | POA: Diagnosis not present

## 2018-03-15 DIAGNOSIS — R269 Unspecified abnormalities of gait and mobility: Secondary | ICD-10-CM | POA: Diagnosis not present

## 2018-03-15 DIAGNOSIS — M25562 Pain in left knee: Secondary | ICD-10-CM | POA: Diagnosis not present

## 2018-03-15 DIAGNOSIS — Z471 Aftercare following joint replacement surgery: Secondary | ICD-10-CM | POA: Diagnosis not present

## 2018-03-20 DIAGNOSIS — M25562 Pain in left knee: Secondary | ICD-10-CM | POA: Diagnosis not present

## 2018-03-20 DIAGNOSIS — R269 Unspecified abnormalities of gait and mobility: Secondary | ICD-10-CM | POA: Diagnosis not present

## 2018-03-20 DIAGNOSIS — R531 Weakness: Secondary | ICD-10-CM | POA: Diagnosis not present

## 2018-03-20 DIAGNOSIS — Z471 Aftercare following joint replacement surgery: Secondary | ICD-10-CM | POA: Diagnosis not present

## 2018-03-22 DIAGNOSIS — M25562 Pain in left knee: Secondary | ICD-10-CM | POA: Diagnosis not present

## 2018-03-22 DIAGNOSIS — R531 Weakness: Secondary | ICD-10-CM | POA: Diagnosis not present

## 2018-03-22 DIAGNOSIS — R269 Unspecified abnormalities of gait and mobility: Secondary | ICD-10-CM | POA: Diagnosis not present

## 2018-03-22 DIAGNOSIS — Z471 Aftercare following joint replacement surgery: Secondary | ICD-10-CM | POA: Diagnosis not present

## 2018-03-27 DIAGNOSIS — R269 Unspecified abnormalities of gait and mobility: Secondary | ICD-10-CM | POA: Diagnosis not present

## 2018-03-27 DIAGNOSIS — R531 Weakness: Secondary | ICD-10-CM | POA: Diagnosis not present

## 2018-03-27 DIAGNOSIS — M25562 Pain in left knee: Secondary | ICD-10-CM | POA: Diagnosis not present

## 2018-03-27 DIAGNOSIS — Z471 Aftercare following joint replacement surgery: Secondary | ICD-10-CM | POA: Diagnosis not present

## 2018-03-29 DIAGNOSIS — R269 Unspecified abnormalities of gait and mobility: Secondary | ICD-10-CM | POA: Diagnosis not present

## 2018-03-29 DIAGNOSIS — R531 Weakness: Secondary | ICD-10-CM | POA: Diagnosis not present

## 2018-03-29 DIAGNOSIS — Z471 Aftercare following joint replacement surgery: Secondary | ICD-10-CM | POA: Diagnosis not present

## 2018-03-29 DIAGNOSIS — M25562 Pain in left knee: Secondary | ICD-10-CM | POA: Diagnosis not present

## 2018-04-02 DIAGNOSIS — M25562 Pain in left knee: Secondary | ICD-10-CM | POA: Diagnosis not present

## 2018-04-02 DIAGNOSIS — R269 Unspecified abnormalities of gait and mobility: Secondary | ICD-10-CM | POA: Diagnosis not present

## 2018-04-02 DIAGNOSIS — Z471 Aftercare following joint replacement surgery: Secondary | ICD-10-CM | POA: Diagnosis not present

## 2018-04-02 DIAGNOSIS — R531 Weakness: Secondary | ICD-10-CM | POA: Diagnosis not present

## 2018-04-04 DIAGNOSIS — M1712 Unilateral primary osteoarthritis, left knee: Secondary | ICD-10-CM | POA: Diagnosis not present

## 2018-04-16 DIAGNOSIS — R531 Weakness: Secondary | ICD-10-CM | POA: Diagnosis not present

## 2018-04-16 DIAGNOSIS — R269 Unspecified abnormalities of gait and mobility: Secondary | ICD-10-CM | POA: Diagnosis not present

## 2018-04-16 DIAGNOSIS — Z471 Aftercare following joint replacement surgery: Secondary | ICD-10-CM | POA: Diagnosis not present

## 2018-04-16 DIAGNOSIS — M25562 Pain in left knee: Secondary | ICD-10-CM | POA: Diagnosis not present

## 2018-04-19 DIAGNOSIS — R531 Weakness: Secondary | ICD-10-CM | POA: Diagnosis not present

## 2018-04-19 DIAGNOSIS — Z471 Aftercare following joint replacement surgery: Secondary | ICD-10-CM | POA: Diagnosis not present

## 2018-04-19 DIAGNOSIS — R269 Unspecified abnormalities of gait and mobility: Secondary | ICD-10-CM | POA: Diagnosis not present

## 2018-04-19 DIAGNOSIS — M25562 Pain in left knee: Secondary | ICD-10-CM | POA: Diagnosis not present

## 2018-04-26 DIAGNOSIS — Z471 Aftercare following joint replacement surgery: Secondary | ICD-10-CM | POA: Diagnosis not present

## 2018-04-26 DIAGNOSIS — M25562 Pain in left knee: Secondary | ICD-10-CM | POA: Diagnosis not present

## 2018-04-26 DIAGNOSIS — R269 Unspecified abnormalities of gait and mobility: Secondary | ICD-10-CM | POA: Diagnosis not present

## 2018-04-26 DIAGNOSIS — R531 Weakness: Secondary | ICD-10-CM | POA: Diagnosis not present

## 2018-04-27 DIAGNOSIS — M25562 Pain in left knee: Secondary | ICD-10-CM | POA: Diagnosis not present

## 2018-04-27 DIAGNOSIS — R269 Unspecified abnormalities of gait and mobility: Secondary | ICD-10-CM | POA: Diagnosis not present

## 2018-04-27 DIAGNOSIS — Z471 Aftercare following joint replacement surgery: Secondary | ICD-10-CM | POA: Diagnosis not present

## 2018-04-27 DIAGNOSIS — R531 Weakness: Secondary | ICD-10-CM | POA: Diagnosis not present

## 2018-05-01 DIAGNOSIS — Z471 Aftercare following joint replacement surgery: Secondary | ICD-10-CM | POA: Diagnosis not present

## 2018-05-01 DIAGNOSIS — M25562 Pain in left knee: Secondary | ICD-10-CM | POA: Diagnosis not present

## 2018-05-01 DIAGNOSIS — R269 Unspecified abnormalities of gait and mobility: Secondary | ICD-10-CM | POA: Diagnosis not present

## 2018-05-01 DIAGNOSIS — R531 Weakness: Secondary | ICD-10-CM | POA: Diagnosis not present

## 2018-05-03 DIAGNOSIS — M25562 Pain in left knee: Secondary | ICD-10-CM | POA: Diagnosis not present

## 2018-05-03 DIAGNOSIS — R531 Weakness: Secondary | ICD-10-CM | POA: Diagnosis not present

## 2018-05-03 DIAGNOSIS — Z471 Aftercare following joint replacement surgery: Secondary | ICD-10-CM | POA: Diagnosis not present

## 2018-05-03 DIAGNOSIS — R269 Unspecified abnormalities of gait and mobility: Secondary | ICD-10-CM | POA: Diagnosis not present

## 2018-05-10 DIAGNOSIS — R269 Unspecified abnormalities of gait and mobility: Secondary | ICD-10-CM | POA: Diagnosis not present

## 2018-05-10 DIAGNOSIS — R531 Weakness: Secondary | ICD-10-CM | POA: Diagnosis not present

## 2018-05-10 DIAGNOSIS — Z471 Aftercare following joint replacement surgery: Secondary | ICD-10-CM | POA: Diagnosis not present

## 2018-05-10 DIAGNOSIS — M25562 Pain in left knee: Secondary | ICD-10-CM | POA: Diagnosis not present

## 2018-06-13 DIAGNOSIS — H35033 Hypertensive retinopathy, bilateral: Secondary | ICD-10-CM | POA: Diagnosis not present

## 2018-06-13 DIAGNOSIS — H524 Presbyopia: Secondary | ICD-10-CM | POA: Diagnosis not present

## 2018-06-18 DIAGNOSIS — I10 Essential (primary) hypertension: Secondary | ICD-10-CM | POA: Diagnosis not present

## 2018-06-18 DIAGNOSIS — M545 Low back pain: Secondary | ICD-10-CM | POA: Diagnosis not present

## 2018-06-18 DIAGNOSIS — Z6841 Body Mass Index (BMI) 40.0 and over, adult: Secondary | ICD-10-CM | POA: Diagnosis not present

## 2018-06-18 DIAGNOSIS — M1712 Unilateral primary osteoarthritis, left knee: Secondary | ICD-10-CM | POA: Diagnosis not present

## 2018-06-18 DIAGNOSIS — M170 Bilateral primary osteoarthritis of knee: Secondary | ICD-10-CM | POA: Diagnosis not present

## 2018-06-18 DIAGNOSIS — J301 Allergic rhinitis due to pollen: Secondary | ICD-10-CM | POA: Diagnosis not present

## 2018-06-18 DIAGNOSIS — Z23 Encounter for immunization: Secondary | ICD-10-CM | POA: Diagnosis not present

## 2018-06-18 DIAGNOSIS — F331 Major depressive disorder, recurrent, moderate: Secondary | ICD-10-CM | POA: Diagnosis not present

## 2018-06-18 DIAGNOSIS — K580 Irritable bowel syndrome with diarrhea: Secondary | ICD-10-CM | POA: Diagnosis not present

## 2018-06-18 DIAGNOSIS — Z9189 Other specified personal risk factors, not elsewhere classified: Secondary | ICD-10-CM | POA: Diagnosis not present

## 2018-06-18 DIAGNOSIS — E782 Mixed hyperlipidemia: Secondary | ICD-10-CM | POA: Diagnosis not present

## 2018-07-31 DIAGNOSIS — R739 Hyperglycemia, unspecified: Secondary | ICD-10-CM | POA: Diagnosis not present

## 2018-07-31 DIAGNOSIS — E782 Mixed hyperlipidemia: Secondary | ICD-10-CM | POA: Diagnosis not present

## 2018-07-31 DIAGNOSIS — I10 Essential (primary) hypertension: Secondary | ICD-10-CM | POA: Diagnosis not present

## 2018-10-25 DIAGNOSIS — F331 Major depressive disorder, recurrent, moderate: Secondary | ICD-10-CM | POA: Diagnosis not present

## 2018-10-25 DIAGNOSIS — E782 Mixed hyperlipidemia: Secondary | ICD-10-CM | POA: Diagnosis not present

## 2018-10-25 DIAGNOSIS — G252 Other specified forms of tremor: Secondary | ICD-10-CM | POA: Diagnosis not present

## 2018-10-25 DIAGNOSIS — M1712 Unilateral primary osteoarthritis, left knee: Secondary | ICD-10-CM | POA: Diagnosis not present

## 2018-10-25 DIAGNOSIS — Z6841 Body Mass Index (BMI) 40.0 and over, adult: Secondary | ICD-10-CM | POA: Diagnosis not present

## 2018-10-25 DIAGNOSIS — K580 Irritable bowel syndrome with diarrhea: Secondary | ICD-10-CM | POA: Diagnosis not present

## 2018-10-25 DIAGNOSIS — I10 Essential (primary) hypertension: Secondary | ICD-10-CM | POA: Diagnosis not present

## 2019-01-05 DIAGNOSIS — I10 Essential (primary) hypertension: Secondary | ICD-10-CM | POA: Diagnosis not present

## 2019-01-05 DIAGNOSIS — Z6841 Body Mass Index (BMI) 40.0 and over, adult: Secondary | ICD-10-CM | POA: Diagnosis not present

## 2019-02-05 DIAGNOSIS — M545 Low back pain: Secondary | ICD-10-CM | POA: Diagnosis not present

## 2019-02-05 DIAGNOSIS — K580 Irritable bowel syndrome with diarrhea: Secondary | ICD-10-CM | POA: Diagnosis not present

## 2019-02-05 DIAGNOSIS — E782 Mixed hyperlipidemia: Secondary | ICD-10-CM | POA: Diagnosis not present

## 2019-02-05 DIAGNOSIS — I10 Essential (primary) hypertension: Secondary | ICD-10-CM | POA: Diagnosis not present

## 2019-02-05 DIAGNOSIS — F331 Major depressive disorder, recurrent, moderate: Secondary | ICD-10-CM | POA: Diagnosis not present

## 2019-02-05 DIAGNOSIS — Z6841 Body Mass Index (BMI) 40.0 and over, adult: Secondary | ICD-10-CM | POA: Diagnosis not present

## 2019-02-05 DIAGNOSIS — M170 Bilateral primary osteoarthritis of knee: Secondary | ICD-10-CM | POA: Diagnosis not present

## 2019-04-30 DIAGNOSIS — F331 Major depressive disorder, recurrent, moderate: Secondary | ICD-10-CM | POA: Diagnosis not present

## 2019-04-30 DIAGNOSIS — E782 Mixed hyperlipidemia: Secondary | ICD-10-CM | POA: Diagnosis not present

## 2019-04-30 DIAGNOSIS — I10 Essential (primary) hypertension: Secondary | ICD-10-CM | POA: Diagnosis not present

## 2019-04-30 DIAGNOSIS — R739 Hyperglycemia, unspecified: Secondary | ICD-10-CM | POA: Diagnosis not present

## 2019-05-03 DIAGNOSIS — E782 Mixed hyperlipidemia: Secondary | ICD-10-CM | POA: Diagnosis not present

## 2019-05-03 DIAGNOSIS — K580 Irritable bowel syndrome with diarrhea: Secondary | ICD-10-CM | POA: Diagnosis not present

## 2019-05-03 DIAGNOSIS — Z0001 Encounter for general adult medical examination with abnormal findings: Secondary | ICD-10-CM | POA: Diagnosis not present

## 2019-05-03 DIAGNOSIS — I10 Essential (primary) hypertension: Secondary | ICD-10-CM | POA: Diagnosis not present

## 2019-05-03 DIAGNOSIS — G252 Other specified forms of tremor: Secondary | ICD-10-CM | POA: Diagnosis not present

## 2019-05-03 DIAGNOSIS — M545 Low back pain: Secondary | ICD-10-CM | POA: Diagnosis not present

## 2019-05-03 DIAGNOSIS — F331 Major depressive disorder, recurrent, moderate: Secondary | ICD-10-CM | POA: Diagnosis not present

## 2019-05-03 DIAGNOSIS — M170 Bilateral primary osteoarthritis of knee: Secondary | ICD-10-CM | POA: Diagnosis not present

## 2019-06-24 DIAGNOSIS — K529 Noninfective gastroenteritis and colitis, unspecified: Secondary | ICD-10-CM | POA: Insufficient documentation

## 2019-07-10 DIAGNOSIS — K529 Noninfective gastroenteritis and colitis, unspecified: Secondary | ICD-10-CM | POA: Diagnosis not present

## 2019-07-29 DIAGNOSIS — R739 Hyperglycemia, unspecified: Secondary | ICD-10-CM | POA: Diagnosis not present

## 2019-07-29 DIAGNOSIS — K580 Irritable bowel syndrome with diarrhea: Secondary | ICD-10-CM | POA: Diagnosis not present

## 2019-07-29 DIAGNOSIS — I10 Essential (primary) hypertension: Secondary | ICD-10-CM | POA: Diagnosis not present

## 2019-07-29 DIAGNOSIS — E782 Mixed hyperlipidemia: Secondary | ICD-10-CM | POA: Diagnosis not present

## 2019-08-01 DIAGNOSIS — J301 Allergic rhinitis due to pollen: Secondary | ICD-10-CM | POA: Diagnosis not present

## 2019-08-01 DIAGNOSIS — Z23 Encounter for immunization: Secondary | ICD-10-CM | POA: Diagnosis not present

## 2019-08-01 DIAGNOSIS — Z6841 Body Mass Index (BMI) 40.0 and over, adult: Secondary | ICD-10-CM | POA: Diagnosis not present

## 2019-08-01 DIAGNOSIS — I10 Essential (primary) hypertension: Secondary | ICD-10-CM | POA: Diagnosis not present

## 2019-08-01 DIAGNOSIS — M545 Low back pain: Secondary | ICD-10-CM | POA: Diagnosis not present

## 2019-08-01 DIAGNOSIS — F331 Major depressive disorder, recurrent, moderate: Secondary | ICD-10-CM | POA: Diagnosis not present

## 2019-08-01 DIAGNOSIS — M170 Bilateral primary osteoarthritis of knee: Secondary | ICD-10-CM | POA: Diagnosis not present

## 2019-08-01 DIAGNOSIS — Z9189 Other specified personal risk factors, not elsewhere classified: Secondary | ICD-10-CM | POA: Diagnosis not present

## 2019-08-01 DIAGNOSIS — R4582 Worries: Secondary | ICD-10-CM | POA: Diagnosis not present

## 2019-08-01 DIAGNOSIS — E782 Mixed hyperlipidemia: Secondary | ICD-10-CM | POA: Diagnosis not present

## 2019-08-01 DIAGNOSIS — K580 Irritable bowel syndrome with diarrhea: Secondary | ICD-10-CM | POA: Diagnosis not present

## 2019-08-01 DIAGNOSIS — M1712 Unilateral primary osteoarthritis, left knee: Secondary | ICD-10-CM | POA: Diagnosis not present

## 2019-08-13 DIAGNOSIS — Z01818 Encounter for other preprocedural examination: Secondary | ICD-10-CM | POA: Diagnosis not present

## 2019-08-15 DIAGNOSIS — I10 Essential (primary) hypertension: Secondary | ICD-10-CM | POA: Diagnosis not present

## 2019-08-15 DIAGNOSIS — Z886 Allergy status to analgesic agent status: Secondary | ICD-10-CM | POA: Diagnosis not present

## 2019-08-15 DIAGNOSIS — K529 Noninfective gastroenteritis and colitis, unspecified: Secondary | ICD-10-CM | POA: Diagnosis not present

## 2019-08-15 DIAGNOSIS — K644 Residual hemorrhoidal skin tags: Secondary | ICD-10-CM | POA: Diagnosis not present

## 2019-09-19 DIAGNOSIS — S46911A Strain of unspecified muscle, fascia and tendon at shoulder and upper arm level, right arm, initial encounter: Secondary | ICD-10-CM | POA: Diagnosis not present

## 2019-10-15 DIAGNOSIS — Z1231 Encounter for screening mammogram for malignant neoplasm of breast: Secondary | ICD-10-CM | POA: Diagnosis not present

## 2019-11-07 DIAGNOSIS — Z20828 Contact with and (suspected) exposure to other viral communicable diseases: Secondary | ICD-10-CM | POA: Diagnosis not present

## 2019-12-06 DIAGNOSIS — M19011 Primary osteoarthritis, right shoulder: Secondary | ICD-10-CM | POA: Diagnosis not present

## 2019-12-06 DIAGNOSIS — S4991XA Unspecified injury of right shoulder and upper arm, initial encounter: Secondary | ICD-10-CM | POA: Diagnosis not present

## 2019-12-06 DIAGNOSIS — M25551 Pain in right hip: Secondary | ICD-10-CM | POA: Diagnosis not present

## 2019-12-06 DIAGNOSIS — M25511 Pain in right shoulder: Secondary | ICD-10-CM | POA: Diagnosis not present

## 2019-12-06 DIAGNOSIS — Z6841 Body Mass Index (BMI) 40.0 and over, adult: Secondary | ICD-10-CM | POA: Diagnosis not present

## 2019-12-06 DIAGNOSIS — M79621 Pain in right upper arm: Secondary | ICD-10-CM | POA: Diagnosis not present

## 2019-12-10 DIAGNOSIS — D485 Neoplasm of uncertain behavior of skin: Secondary | ICD-10-CM | POA: Diagnosis not present

## 2019-12-19 DIAGNOSIS — Z23 Encounter for immunization: Secondary | ICD-10-CM | POA: Diagnosis not present

## 2020-12-11 ENCOUNTER — Other Ambulatory Visit (HOSPITAL_COMMUNITY): Payer: Self-pay | Admitting: Family Medicine

## 2020-12-11 ENCOUNTER — Other Ambulatory Visit: Payer: Self-pay | Admitting: Family Medicine

## 2020-12-11 DIAGNOSIS — M79661 Pain in right lower leg: Secondary | ICD-10-CM | POA: Diagnosis not present

## 2020-12-11 DIAGNOSIS — E7849 Other hyperlipidemia: Secondary | ICD-10-CM | POA: Diagnosis not present

## 2020-12-11 DIAGNOSIS — E782 Mixed hyperlipidemia: Secondary | ICD-10-CM | POA: Diagnosis not present

## 2020-12-11 DIAGNOSIS — R252 Cramp and spasm: Secondary | ICD-10-CM | POA: Diagnosis not present

## 2020-12-11 DIAGNOSIS — I10 Essential (primary) hypertension: Secondary | ICD-10-CM | POA: Diagnosis not present

## 2021-01-16 ENCOUNTER — Other Ambulatory Visit: Payer: Self-pay

## 2021-01-16 DIAGNOSIS — I83893 Varicose veins of bilateral lower extremities with other complications: Secondary | ICD-10-CM

## 2021-02-05 ENCOUNTER — Ambulatory Visit: Payer: Medicare Other | Admitting: Physician Assistant

## 2021-02-05 ENCOUNTER — Ambulatory Visit (HOSPITAL_COMMUNITY)
Admission: RE | Admit: 2021-02-05 | Discharge: 2021-02-05 | Disposition: A | Discharge status: Home / Self Care | Payer: Medicare Other | Source: Ambulatory Visit | Attending: Vascular Surgery | Admitting: Vascular Surgery

## 2021-02-05 ENCOUNTER — Other Ambulatory Visit: Payer: Self-pay

## 2021-02-05 ENCOUNTER — Encounter: Payer: Self-pay | Admitting: Physician Assistant

## 2021-02-05 VITALS — BP 159/94 | HR 61 | Temp 98.1°F | Resp 20 | Ht 68.0 in | Wt 296.3 lb

## 2021-02-05 DIAGNOSIS — I872 Venous insufficiency (chronic) (peripheral): Secondary | ICD-10-CM | POA: Diagnosis not present

## 2021-02-05 DIAGNOSIS — I83893 Varicose veins of bilateral lower extremities with other complications: Secondary | ICD-10-CM

## 2021-02-05 NOTE — Progress Notes (Signed)
[facility] [place]   Reason for referral: right > left leg pain the wakes her from her sleep and with ambulation "charlie horse.  "  History of Present Illness  Sandra Frank is a 60 y.o. female who presents with chief complaint: swollen leg.  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 She also did have mild deep venous reflux. She had no evidence of DVT.  She has been wearing 20-30 mm hg compression for many years.  She was last evaluated by [place] in 2013.  After that visit she was scheduled to f/u with Dr. Donnetta Hutching for consideration of laser ablation treatment.   She was then sent to another vein specialist and had intervention of unknown type.     Pain has increased since March 2022.   She is woken from sleep with right calf cramping every night and has to press her foot against the wall to relieve the pain.  She walks with a cane because her calf locks up and sometimes causes her to fall.  He symptoms of possible claudication and rest pain.  She also has a history of lumbar surgery in 2020 which could be nerve compression/stenosis.    She denise non healing wounds.  She does have a history of lumbar surgery in 2010.     Past Medical History:  Diagnosis Date  . Anemia   . Left knee pain   . Low back pain    History of surgery and epidural injections  . Varicose veins     Past Surgical History:  Procedure Laterality Date  . ABDOMINAL HYSTERECTOMY     2010  . BACK SURGERY  1997  . bladder   2001   tact  . CHOLECYSTECTOMY    . tubaligation  1991    Social History   Socioeconomic History  . Marital status: Married    Spouse name: Not on file  . Number of children: Not on file  . Years of education: Not on file  . Highest education level: Not on file  Occupational History  . Not on file  Tobacco Use  . Smoking status: Never Smoker  . Smokeless tobacco: Never Used  Substance and Sexual Activity  . Alcohol  use: No  . Drug use: No  . Sexual activity: Not on file  Other Topics Concern  . Not on file  Social History Narrative  . Not on file   Social Determinants of Health   Financial Resource Strain: Not on file  Food Insecurity: Not on file  Transportation Needs: Not on file  Physical Activity: Not on file  Stress: Not on file  Social Connections: Not on file  Intimate Partner Violence: Not on file    Family History  Problem Relation Age of Onset  . Heart disease Mother   . Hypertension Mother   . Other Mother        varicose veins  . Hyperlipidemia Daughter   . Hypertension Daughter   . Other Daughter        varicose veins    Current Outpatient Medications on File Prior to Visit  Medication Sig Dispense Refill  . aspirin 81 MG chewable tablet Chew by mouth.    . hydrochlorothiazide (HYDRODIURIL) 12.5 MG tablet Take 12.5 mg by mouth daily.    . Multiple Vitamins-Minerals (MULTIVITAMIN PO) Take 1 tablet by mouth daily.    Marland Kitchen omeprazole (PRILOSEC) 20 MG capsule Take 20 mg  by mouth daily.    . phentermine (ADIPEX-P) 37.5 MG tablet Take 37.5 mg by mouth daily.    . rosuvastatin (CRESTOR) 10 MG tablet Take 10 mg by mouth at bedtime.    Marland Kitchen tiZANidine (ZANAFLEX) 4 MG tablet Take 4 mg by mouth every 8 (eight) hours as needed.     . traMADol (ULTRAM) 50 MG tablet Take 100 mg by mouth 3 (three) times daily.     No current facility-administered medications on file prior to visit.    Allergies as of 02/05/2021 - Review Complete 02/05/2021  Allergen Reaction Noted  . Propoxyphene Diarrhea 05/27/2015     ROS:   General:  No weight loss, Fever, chills  HEENT: No recent headaches, no nasal bleeding, no visual changes, no sore throat  Neurologic: No dizziness, blackouts, seizures. No recent symptoms of stroke or mini- stroke. No recent episodes of slurred speech, or temporary blindness.  Cardiac: No recent episodes of chest pain/pressure, no shortness of breath at rest.  No  shortness of breath with exertion.  Denies history of atrial fibrillation or irregular heartbeat  Vascular: No history of rest pain in feet.  No history of claudication.  No history of non-healing ulcer, No history of DVT   Pulmonary: No home oxygen, no productive cough, no hemoptysis,  No asthma or wheezing  Musculoskeletal:  [ ]  Arthritis, x[ ]  Low back pain,  [x ] Joint pain  Hematologic:No history of hypercoagulable state.  No history of easy bleeding.  No history of anemia  Gastrointestinal: No hematochezia or melena,  No gastroesophageal reflux, no trouble swallowing  Urinary: [ ]  chronic Kidney disease, [ ]  on HD - [ ]  MWF or [ ]  TTHS, [ ]  Burning with urination, [ ]  Frequent urination, [ ]  Difficulty urinating;   Skin: No rashes  Psychological: No history of anxiety,  No history of depression  Physical Examination  Vitals:   02/05/21 0954  BP: (!) 159/94  Pulse: 61  Resp: 20  Temp: 98.1 F (36.7 C)  TempSrc: Temporal  SpO2: 99%  Weight: 296 lb 4.8 oz (134.4 kg)  Height: 5\' 8"  (1.727 m)    Body mass index is 45.05 kg/m.  General:  Alert and oriented, no acute distress HEENT: Normal Neck: No bruit or JVD Pulmonary: Clear to auscultation bilaterally Cardiac: Regular Rate and Rhythm without murmur Abdomen: Soft, non-tender, non-distended, no mass, no scars Skin: No rash Extremity Pulses:  2+ radial, brachial, doppler signals intact B LE not palpable femoral, non palpable dorsalis pedis, posterior tibial  bilaterally Musculoskeletal: No deformity or edema  Neurologic: Upper and lower extremity motor 5/5 and symmetric  DATA:   Venous Reflux Times  +----------------------+---------+------+-----------+------------+--------+   RIGHT         Reflux NoRefluxReflux TimeDiameter  cmsComments                   Yes                     +----------------------+---------+------+-----------+------------+--------+    CFV          no                           +----------------------+---------+------+-----------+------------+--------+   FV prox        no                           +----------------------+---------+------+-----------+------------+--------+   FV  mid        no                           +----------------------+---------+------+-----------+------------+--------+   FV dist        no                           +----------------------+---------+------+-----------+------------+--------+   GSV at [facility]            yes  >500 ms   0.725          +----------------------+---------+------+-----------+------------+--------+   GSV at knee            yes         0.301          +----------------------+---------+------+-----------+------------+--------+   GSV prox calf                     0.294          +----------------------+---------+------+-----------+------------+--------+   GSV mid calf                     0.260          +----------------------+---------+------+-----------+------------+--------+   SSV Pop Fossa           yes         0.329          +----------------------+---------+------+-----------+------------+--------+   SSV prox calf           yes         0.237          +----------------------+---------+------+-----------+------------+--------+   SSV mid calf                     0.219          +----------------------+---------+------+-----------+------------+--------+   AAGSV SFJ       no               0.574          +----------------------+---------+------+-----------+------------+--------+   AAGSV Prox  thigh   no               0.411          +----------------------+---------+------+-----------+------------+--------+   AAGSV prx to mid thighno                           +----------------------+---------+------+-----------+------------+--------+      +--------------+---------+------+-----------+------------+--------+  LEFT     Reflux NoRefluxReflux TimeDiameter cmsComments               Yes                   +--------------+---------+------+-----------+------------+--------+  CFV      no                         +--------------+---------+------+-----------+------------+--------+  FV prox    no                         +--------------+---------+------+-----------+------------+--------+  FV mid    no                         +--------------+---------+------+-----------+------------+--------+  FV dist    no                         +--------------+---------+------+-----------+------------+--------+  Popliteal   no                         +--------------+---------+------+-----------+------------+--------+  GSV at [facility]        yes  >500 ms   0.746        +--------------+---------+------+-----------+------------+--------+  GSV prox thigh                0.500        +--------------+---------+------+-----------+------------+--------+  GSV mid thigh                 0.319        +--------------+---------+------+-----------+------------+--------+  GSV dist thigh                0.383        +--------------+---------+------+-----------+------------+--------+  GSV at knee                  0.362         +--------------+---------+------+-----------+------------+--------+  GSV prox calf                 0.276        +--------------+---------+------+-----------+------------+--------+  GSV mid calf                 0.182        +--------------+---------+------+-----------+------------+--------+  SSV Pop Fossa no               0.257        +--------------+---------+------+-----------+------------+--------+  SSV prox calf no               0.231        +--------------+---------+------+-----------+------------+--------+  SSV mid calf       yes  >500 ms   0.256        +--------------+---------+------+-----------+------------+--------+      Summary:     Assessment/Plan:  Venous insufficiency Bilateral:  - No evidence of deep vein thrombosis seen in the lower extremities,  bilaterally, from the common femoral through the popliteal veins.  - No evidence of superficial venous thrombosis in the lower extremities,  bilaterally.    Right:  - Venous reflux is noted in the right sapheno-femoral junction.  - The thigh segment of the great saphenous vein is not identified  consistent with prior ablation.    Left:  - Segment of proximal thigh GSV not identified consistent with prior  ablation. Incompetent pelvic veins seen connecting with the saphenofemoral  junction and coursing posterior along the thigh and connecting to the  residual great saphenous vein at the  distal thigh giving rise to an incompetent superficial vein in the calf.   She has symptoms that could be arterial verses lumbar stenosis.  I will order aortoiliac duplex and B ABI's.  I would like for her to be seen by DR. Fields at her next appointment.  Her PCP may want to have her f/u with a spine specialist to examine her lumbar spine to r/o spinal stenosis.  F/U in 4-12 weeks pending schedule.  She is  not at risk of limb loss at this time with intact doppler signals PT/Peroneal> DP B LE.      Mosetta Pigeon PA-C [facility] [place] Office: [phone]  MD on call Myra Gianotti

## 2021-02-10 ENCOUNTER — Other Ambulatory Visit: Payer: Self-pay

## 2021-02-10 DIAGNOSIS — I872 Venous insufficiency (chronic) (peripheral): Secondary | ICD-10-CM

## 2021-03-18 DIAGNOSIS — Z1231 Encounter for screening mammogram for malignant neoplasm of breast: Secondary | ICD-10-CM | POA: Diagnosis not present

## 2021-04-08 ENCOUNTER — Encounter (HOSPITAL_COMMUNITY): Payer: Medicare Other

## 2021-04-08 ENCOUNTER — Ambulatory Visit: Payer: Medicare Other | Admitting: Vascular Surgery

## 2023-01-05 ENCOUNTER — Encounter (HOSPITAL_COMMUNITY): Payer: Self-pay | Admitting: Emergency Medicine

## 2023-01-05 ENCOUNTER — Emergency Department (HOSPITAL_COMMUNITY): Payer: No Typology Code available for payment source

## 2023-01-05 ENCOUNTER — Emergency Department (HOSPITAL_COMMUNITY)
Admission: EM | Admit: 2023-01-05 | Discharge: 2023-01-05 | Disposition: A | Discharge status: Home / Self Care | Payer: No Typology Code available for payment source | Attending: Student | Admitting: Student

## 2023-01-05 ENCOUNTER — Other Ambulatory Visit: Payer: Self-pay

## 2023-01-05 ENCOUNTER — Other Ambulatory Visit (INDEPENDENT_AMBULATORY_CARE_PROVIDER_SITE_OTHER): Payer: No Typology Code available for payment source

## 2023-01-05 DIAGNOSIS — R55 Syncope and collapse: Secondary | ICD-10-CM | POA: Diagnosis not present

## 2023-01-05 DIAGNOSIS — Z7982 Long term (current) use of aspirin: Secondary | ICD-10-CM | POA: Insufficient documentation

## 2023-01-05 DIAGNOSIS — D72819 Decreased white blood cell count, unspecified: Secondary | ICD-10-CM | POA: Insufficient documentation

## 2023-01-05 LAB — COMPREHENSIVE METABOLIC PANEL
ALT: 12 U/L (ref 0–44)
AST: 14 U/L — ABNORMAL LOW (ref 15–41)
Albumin: 4.1 g/dL (ref 3.5–5.0)
Alkaline Phosphatase: 45 U/L (ref 38–126)
Anion gap: 8 (ref 5–15)
BUN: 10 mg/dL (ref 6–20)
CO2: 27 mmol/L (ref 22–32)
Calcium: 9.2 mg/dL (ref 8.9–10.3)
Chloride: 101 mmol/L (ref 98–111)
Creatinine, Ser: 0.72 mg/dL (ref 0.44–1.00)
GFR, Estimated: >60 mL/min (ref >60)
Glucose, Bld: 110 mg/dL — ABNORMAL HIGH (ref 70–99)
Potassium: 4.1 mmol/L (ref 3.5–5.1)
Sodium: 136 mmol/L (ref 135–145)
Total Bilirubin: 0.9 mg/dL (ref 0.3–1.2)
Total Protein: 7.8 g/dL (ref 6.5–8.1)

## 2023-01-05 LAB — CBC WITH DIFFERENTIAL/PLATELET
Abs Immature Granulocytes: 0.01 10*3/uL (ref 0.00–0.07)
Basophils Absolute: 0 10*3/uL (ref 0.0–0.1)
Basophils Relative: 1 %
Eosinophils Absolute: 0 10*3/uL (ref 0.0–0.5)
Eosinophils Relative: 1 %
HCT: 38.7 % (ref 36.0–46.0)
Hemoglobin: 12.8 g/dL (ref 12.0–15.0)
Immature Granulocytes: 0 %
Lymphocytes Relative: 29 %
Lymphs Abs: 0.8 10*3/uL (ref 0.7–4.0)
MCH: 29.3 pg (ref 26.0–34.0)
MCHC: 33.1 g/dL (ref 30.0–36.0)
MCV: 88.6 fL (ref 80.0–100.0)
Monocytes Absolute: 0.3 10*3/uL (ref 0.1–1.0)
Monocytes Relative: 10 %
Neutro Abs: 1.7 10*3/uL (ref 1.7–7.7)
Neutrophils Relative %: 59 %
Platelets: 267 10*3/uL (ref 150–400)
RBC: 4.37 MIL/uL (ref 3.87–5.11)
RDW: 13.6 % (ref 11.5–15.5)
WBC: 2.9 10*3/uL — ABNORMAL LOW (ref 4.0–10.5)
nRBC: 0 % (ref 0.0–0.2)

## 2023-01-05 LAB — CBG MONITORING, ED: Glucose-Capillary: 97 mg/dL (ref 70–99)

## 2023-01-05 LAB — TROPONIN I (HIGH SENSITIVITY): Troponin I (High Sensitivity): 4 ng/L (ref <18)

## 2023-01-05 NOTE — Discharge Instructions (Addendum)
You were seen in the emergency room for evaluation of passing out.  Your workup in the emergency department was reassuring but you are unfortunately not allowed to drive until cleared by cardiologist.  Please call the number above to set up an urgent follow-up for heart monitor placement.  Return to the emergency department if you have additional episodes of passing out or have any chest pain, palpitations, shortness of breath or any other concerning symptoms.

## 2023-01-05 NOTE — ED Notes (Signed)
Patient transported to CT 
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
## 2023-01-09 ENCOUNTER — Telehealth: Payer: Self-pay | Admitting: Cardiology

## 2023-01-09 NOTE — Telephone Encounter (Signed)
New Message:    Patient received her Monitor today. She wants to know when can she come and get someone to help her put it on?Sandra Frank

## 2023-01-09 NOTE — Telephone Encounter (Signed)
Patient will come to [place] office at 4:30 pm on 4/2 for monitor placement.

## 2023-01-10 DIAGNOSIS — R55 Syncope and collapse: Secondary | ICD-10-CM | POA: Diagnosis not present

## 2023-01-24 ENCOUNTER — Ambulatory Visit (INDEPENDENT_AMBULATORY_CARE_PROVIDER_SITE_OTHER): Payer: No Typology Code available for payment source | Admitting: Neurology

## 2023-01-24 ENCOUNTER — Encounter: Payer: Self-pay | Admitting: Neurology

## 2023-01-24 VITALS — BP 145/88 | HR 77 | Ht 68.0 in | Wt 291.2 lb

## 2023-01-24 DIAGNOSIS — G44319 Acute post-traumatic headache, not intractable: Secondary | ICD-10-CM

## 2023-01-24 DIAGNOSIS — R55 Syncope and collapse: Secondary | ICD-10-CM

## 2023-01-24 NOTE — Patient Instructions (Signed)
Good to meet you.  Schedule 1-hour EEG. If normal, we will plan for a 24-hour EEG  2. Schedule MRI brain with and without contrast  3. It is prudent to recommend that all persons should be free of syncopal (loss of consciousness) episodes for at least six months to be granted the driving privilege." (THE [place] PHYSICIAN'S GUIDE TO DRIVER MEDICAL EVALUATION, Second Edition, Medical Review Branch, Associate Professor, Division of [employer] of Transportation, July 2004)  4. Follow-up after tests, call for any changes
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
## 2023-01-25 ENCOUNTER — Encounter: Payer: Self-pay | Admitting: Neurology

## 2023-01-25 ENCOUNTER — Ambulatory Visit (INDEPENDENT_AMBULATORY_CARE_PROVIDER_SITE_OTHER): Payer: No Typology Code available for payment source | Admitting: Neurology

## 2023-01-25 DIAGNOSIS — R55 Syncope and collapse: Secondary | ICD-10-CM | POA: Diagnosis not present

## 2023-01-25 NOTE — Progress Notes (Signed)
EEG complete - results pending 
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
## 2023-02-07 ENCOUNTER — Telehealth: Payer: Self-pay

## 2023-02-07 DIAGNOSIS — R55 Syncope and collapse: Secondary | ICD-10-CM

## 2023-02-07 NOTE — Telephone Encounter (Signed)
Pt called an informed that EEG is normal. We discussed doing a 24-hour EEG if the office EEG is normal.

## 2023-02-07 NOTE — Telephone Encounter (Signed)
-----   Message from Van Clines, MD sent at 02/07/2023 12:34 PM EDT ----- Pls let her know EEG is normal. We discussed doing a 24-hour EEG if the office EEG is normal. Pls order if she would like to proceed, thanks

## 2023-02-10 ENCOUNTER — Other Ambulatory Visit: Payer: Self-pay | Admitting: Family Medicine

## 2023-02-10 DIAGNOSIS — Z0000 Encounter for general adult medical examination without abnormal findings: Secondary | ICD-10-CM

## 2023-02-17 ENCOUNTER — Ambulatory Visit
Admission: RE | Admit: 2023-02-17 | Discharge: 2023-02-17 | Disposition: A | Discharge status: Home / Self Care | Payer: No Typology Code available for payment source | Source: Ambulatory Visit | Attending: Family Medicine | Admitting: Family Medicine

## 2023-02-17 DIAGNOSIS — Z0000 Encounter for general adult medical examination without abnormal findings: Secondary | ICD-10-CM

## 2023-02-21 ENCOUNTER — Ambulatory Visit: Payer: No Typology Code available for payment source | Attending: Cardiology | Admitting: Cardiology

## 2023-02-21 ENCOUNTER — Encounter: Payer: Self-pay | Admitting: Cardiology

## 2023-02-21 VITALS — BP 170/90 | HR 67 | Ht 68.0 in | Wt 298.6 lb

## 2023-02-21 DIAGNOSIS — R0602 Shortness of breath: Secondary | ICD-10-CM

## 2023-02-21 DIAGNOSIS — Z79899 Other long term (current) drug therapy: Secondary | ICD-10-CM

## 2023-02-21 DIAGNOSIS — I10 Essential (primary) hypertension: Secondary | ICD-10-CM

## 2023-02-21 DIAGNOSIS — R55 Syncope and collapse: Secondary | ICD-10-CM

## 2023-02-21 MED ORDER — CHLORTHALIDONE 25 MG PO TABS: 25.0000 mg | ORAL_TABLET | Freq: Every day | ORAL | 6 refills | Status: DC

## 2023-02-21 NOTE — Patient Instructions (Signed)
Medication Instructions:   Stop HCTZ (Hydrochlorothiazide) Begin Chlorthalidone 25mg  daily  Continue all other medications.     Labwork:  BMET, Mg - orders given Please do in 2 weeks   Testing/Procedures:  Your physician has requested that you have an echocardiogram. Echocardiography is a painless test that uses sound waves to create images of your heart. It provides your doctor with information about the size and shape of your heart and how well your heart's chambers and valves are working. This procedure takes approximately one hour. There are no restrictions for this procedure. Please do NOT wear cologne, perfume, aftershave, or lotions (deodorant is allowed). Please arrive 15 minutes prior to your appointment time.  A chest x-ray takes a picture of the organs and structures inside the chest, including the heart, lungs, and blood vessels. This test can show several things, including, whether the heart is enlarges; whether fluid is building up in the lungs; and whether pacemaker / defibrillator leads are still in place.  Your physician has recommended that you have a pulmonary function test. Pulmonary Function Tests are a group of tests that measure how well air moves in and out of your lungs.     Follow-Up:  Office will contact with results via phone, letter or mychart.    6 weeks   Any Other Special Instructions Will Be Listed Below (If Applicable).   If you need a refill on your cardiac medications before your next appointment, please call your pharmacy.
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
## 2023-02-22 ENCOUNTER — Ambulatory Visit (HOSPITAL_COMMUNITY)
Admission: RE | Admit: 2023-02-22 | Discharge: 2023-02-22 | Disposition: A | Discharge status: Home / Self Care | Payer: No Typology Code available for payment source | Source: Ambulatory Visit | Attending: Cardiology | Admitting: Cardiology

## 2023-02-22 DIAGNOSIS — R0602 Shortness of breath: Secondary | ICD-10-CM | POA: Diagnosis present

## 2023-03-01 ENCOUNTER — Ambulatory Visit (INDEPENDENT_AMBULATORY_CARE_PROVIDER_SITE_OTHER): Payer: No Typology Code available for payment source | Admitting: Neurology

## 2023-03-01 DIAGNOSIS — R55 Syncope and collapse: Secondary | ICD-10-CM

## 2023-03-01 NOTE — Progress Notes (Signed)
Ambulatory EEG hooked up and running. Light flashing. Push button tested. Camera and event log explained. Batteries explained. Patient understood.   

## 2023-03-02 NOTE — Progress Notes (Signed)
AMB EEG discontinued. No skin breakdown at unhook.   

## 2023-03-10 ENCOUNTER — Telehealth: Payer: Self-pay | Admitting: *Deleted

## 2023-03-10 NOTE — Telephone Encounter (Signed)
Lesle Chris, LPN 1/61/0960  4:54 PM EDT Back to Top    Notified, copy to pcp.

## 2023-03-10 NOTE — Telephone Encounter (Signed)
-----   Message from Antoine Poche, MD sent at 03/10/2023  1:56 PM EDT ----- Normal chest xray  Dominga Ferry MD
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
## 2023-04-06 ENCOUNTER — Telehealth: Payer: Self-pay

## 2023-04-06 NOTE — Telephone Encounter (Signed)
-----   Message from Van Clines, MD sent at 04/03/2023  3:56 PM EDT ----- Pls let him know the prolonged EEG is normal. Proceed with brain MRI, I don't see it scheduled? Thanks

## 2023-04-06 NOTE — Telephone Encounter (Signed)
Pt called per DPR left a voice mail that prolonged EEG is normal. Number to [facility] left if they have not heard from them to get MRI scheduled

## 2023-04-11 ENCOUNTER — Ambulatory Visit (HOSPITAL_COMMUNITY)
Admission: RE | Admit: 2023-04-11 | Discharge: 2023-04-11 | Disposition: A | Discharge status: Home / Self Care | Payer: PRIVATE HEALTH INSURANCE | Source: Ambulatory Visit | Attending: Cardiology | Admitting: Cardiology

## 2023-04-11 DIAGNOSIS — R55 Syncope and collapse: Secondary | ICD-10-CM | POA: Diagnosis present

## 2023-04-11 LAB — ECHOCARDIOGRAM COMPLETE
AR max vel: 1.96 cm2
AV Area VTI: 2.47 cm2
AV Area mean vel: 2.4 cm2
AV Mean grad: 5.5 mmHg
AV Peak grad: 14.5 mmHg
Ao pk vel: 1.91 m/s
Area-P 1/2: 2.8 cm2
S' Lateral: 2.9 cm

## 2023-04-11 NOTE — Progress Notes (Signed)
*  PRELIMINARY RESULTS* Echocardiogram 2D Echocardiogram has been performed.  Stacey Drain 04/11/2023, 11:35 AM

## 2023-04-12 ENCOUNTER — Telehealth: Payer: Self-pay | Admitting: Nurse Practitioner

## 2023-04-12 ENCOUNTER — Encounter: Payer: Self-pay | Admitting: Nurse Practitioner

## 2023-04-12 ENCOUNTER — Ambulatory Visit: Payer: No Typology Code available for payment source | Attending: Nurse Practitioner | Admitting: Nurse Practitioner

## 2023-04-12 VITALS — BP 130/80 | HR 72 | Ht 69.0 in | Wt 291.6 lb

## 2023-04-12 DIAGNOSIS — I10 Essential (primary) hypertension: Secondary | ICD-10-CM

## 2023-04-12 DIAGNOSIS — R0902 Hypoxemia: Secondary | ICD-10-CM

## 2023-04-12 DIAGNOSIS — Z87898 Personal history of other specified conditions: Secondary | ICD-10-CM

## 2023-04-12 DIAGNOSIS — R6889 Other general symptoms and signs: Secondary | ICD-10-CM

## 2023-04-12 DIAGNOSIS — H539 Unspecified visual disturbance: Secondary | ICD-10-CM

## 2023-04-12 MED ORDER — HYDROCHLOROTHIAZIDE 12.5 MG PO TABS: 12.5000 mg | ORAL_TABLET | Freq: Every day | ORAL | 4 refills | Status: AC
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
## 2023-04-12 NOTE — Patient Instructions (Addendum)
Medication Instructions:  Your physician recommends that you continue on your current medications as directed. Please refer to the Current Medication list given to you today.  Labwork: BMET and MAG in 1-2 weeks at [facility]   Testing/Procedures: Your physician has requested that you have a carotid duplex. This test is an ultrasound of the carotid arteries in your neck. It looks at blood flow through these arteries that supply the brain with blood. Allow one hour for this exam. There are no restrictions or special instructions.  Follow-Up: Your physician recommends that you schedule a follow-up appointment in: 2-3 Months with Philis Nettle  Any Other Special Instructions Will Be Listed Below (If Applicable). Referral sent to Electrophysiology with Dr.Mealor   If you need a refill on your cardiac medications before your next appointment, please call your pharmacy.

## 2023-04-12 NOTE — Telephone Encounter (Signed)
Checking percert on the following patient for testing scheduled at [facility].    [place] CARTOID 04/28/2023

## 2023-04-28 ENCOUNTER — Ambulatory Visit (HOSPITAL_COMMUNITY)
Admission: RE | Admit: 2023-04-28 | Discharge: 2023-04-28 | Disposition: A | Discharge status: Home / Self Care | Payer: No Typology Code available for payment source | Source: Ambulatory Visit | Attending: Nurse Practitioner | Admitting: Nurse Practitioner

## 2023-04-28 DIAGNOSIS — Z87898 Personal history of other specified conditions: Secondary | ICD-10-CM | POA: Diagnosis not present
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
## 2023-06-08 ENCOUNTER — Encounter: Payer: Self-pay | Admitting: Cardiovascular Disease

## 2023-06-08 ENCOUNTER — Ambulatory Visit
Payer: No Typology Code available for payment source | Attending: Cardiovascular Disease | Admitting: Cardiovascular Disease

## 2023-06-08 VITALS — BP 122/78 | HR 71 | Ht 69.0 in | Wt 292.0 lb

## 2023-06-08 DIAGNOSIS — I10 Essential (primary) hypertension: Secondary | ICD-10-CM | POA: Diagnosis not present

## 2023-06-08 DIAGNOSIS — R55 Syncope and collapse: Secondary | ICD-10-CM

## 2023-06-08 NOTE — Patient Instructions (Signed)

## 2023-07-21 ENCOUNTER — Ambulatory Visit: Payer: No Typology Code available for payment source | Attending: Nurse Practitioner | Admitting: Nurse Practitioner

## 2023-07-21 ENCOUNTER — Encounter: Payer: Self-pay | Admitting: Nurse Practitioner

## 2023-07-21 VITALS — BP 130/90 | HR 80 | Ht 69.0 in | Wt 292.2 lb

## 2023-07-21 DIAGNOSIS — Z87898 Personal history of other specified conditions: Secondary | ICD-10-CM

## 2023-07-21 DIAGNOSIS — R0902 Hypoxemia: Secondary | ICD-10-CM

## 2023-07-21 DIAGNOSIS — I10 Essential (primary) hypertension: Secondary | ICD-10-CM | POA: Diagnosis not present

## 2023-07-21 DIAGNOSIS — R4701 Aphasia: Secondary | ICD-10-CM

## 2023-07-21 DIAGNOSIS — R6889 Other general symptoms and signs: Secondary | ICD-10-CM | POA: Diagnosis not present
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
## 2023-07-21 NOTE — Patient Instructions (Addendum)

## 2023-07-28 ENCOUNTER — Telehealth: Payer: Self-pay | Admitting: Cardiology

## 2023-07-28 DIAGNOSIS — Z79899 Other long term (current) drug therapy: Secondary | ICD-10-CM

## 2023-07-28 DIAGNOSIS — E7800 Pure hypercholesterolemia, unspecified: Secondary | ICD-10-CM

## 2023-07-28 NOTE — Telephone Encounter (Signed)
Advised patient of results lab orders placed and mailed to patients home

## 2023-07-28 NOTE — Telephone Encounter (Signed)
Pt states that she is returning nurses call regarding results. Please advise

## 2023-10-06 ENCOUNTER — Encounter: Payer: Self-pay | Admitting: Cardiology

## 2023-12-15 ENCOUNTER — Encounter: Payer: Self-pay | Admitting: Cardiology

## 2024-01-26 ENCOUNTER — Telehealth: Payer: Self-pay | Admitting: Cardiology

## 2024-01-26 NOTE — Telephone Encounter (Signed)
Numerous attempts to contact patient with recall letters. Unable to reach by telephone. with no success.   

## 2024-05-02 ENCOUNTER — Other Ambulatory Visit: Payer: Self-pay | Admitting: Cardiology
